# Patient Record
Sex: Female | Born: 1945 | Smoking: Never smoker
Health system: Southern US, Community
[De-identification: ages and names within clinical notes are randomized; demographics above are authoritative.]

## PROBLEM LIST (undated history)

## (undated) DIAGNOSIS — I10 Essential (primary) hypertension: Secondary | ICD-10-CM

## (undated) DIAGNOSIS — R928 Other abnormal and inconclusive findings on diagnostic imaging of breast: Secondary | ICD-10-CM

## (undated) DIAGNOSIS — K219 Gastro-esophageal reflux disease without esophagitis: Secondary | ICD-10-CM

## (undated) DIAGNOSIS — M810 Age-related osteoporosis without current pathological fracture: Secondary | ICD-10-CM

## (undated) DIAGNOSIS — E785 Hyperlipidemia, unspecified: Secondary | ICD-10-CM

## (undated) DIAGNOSIS — M069 Rheumatoid arthritis, unspecified: Secondary | ICD-10-CM

## (undated) DIAGNOSIS — R42 Dizziness and giddiness: Secondary | ICD-10-CM

## (undated) DIAGNOSIS — M1990 Unspecified osteoarthritis, unspecified site: Secondary | ICD-10-CM

## (undated) HISTORY — DX: Hyperlipidemia, unspecified: E78.5

## (undated) HISTORY — DX: Unspecified osteoarthritis, unspecified site: M19.90

## (undated) HISTORY — DX: Essential (primary) hypertension: I10

## (undated) HISTORY — DX: Rheumatoid arthritis, unspecified: M06.9

## (undated) HISTORY — DX: Other abnormal and inconclusive findings on diagnostic imaging of breast: R92.8

## (undated) HISTORY — DX: Age-related osteoporosis without current pathological fracture: M81.0

## (undated) HISTORY — DX: Gastro-esophageal reflux disease without esophagitis: K21.9

## (undated) HISTORY — DX: Dizziness and giddiness: R42

## (undated) HISTORY — PX: BREAST SURGERY: SHX581

---

## 2000-08-06 HISTORY — PX: COSMETIC SURGERY: SHX468

## 2005-09-20 HISTORY — PX: SHOULDER SURGERY: SHX246

## 2008-04-16 HISTORY — PX: COLONOSCOPY: SHX174

## 2008-04-21 HISTORY — PX: BLADDER SUSPENSION: SHX72

## 2011-12-06 HISTORY — PX: ESOPHAGOGASTRODUODENOSCOPY ENDOSCOPY: SHX5814

## 2013-10-23 DIAGNOSIS — R42 Dizziness and giddiness: Secondary | ICD-10-CM

## 2013-10-23 HISTORY — DX: Dizziness and giddiness: R42

## 2015-06-18 ENCOUNTER — Ambulatory Visit (INDEPENDENT_AMBULATORY_CARE_PROVIDER_SITE_OTHER): Payer: Medicare Other | Admitting: Obstetrics & Gynecology

## 2015-06-18 ENCOUNTER — Encounter: Payer: Self-pay | Admitting: Obstetrics & Gynecology

## 2015-06-18 VITALS — BP 125/73 | HR 65 | Ht 66.0 in | Wt 129.0 lb

## 2015-06-18 DIAGNOSIS — N905 Atrophy of vulva: Secondary | ICD-10-CM

## 2015-06-18 MED ORDER — AMBULATORY NON FORMULARY MEDICATION: Status: DC

## 2015-06-18 NOTE — Progress Notes (Signed)
   Subjective:    Patient ID: Erica Kirk, female    DOB: 1946/07/10, 70 y.o.   MRN: [id number]  HPI 69 yo MW P3 2 LC (34 and 74 yo kids, 2 grands) here with the complaint of vulvar itching for about 10 days. She tried nystatin with no help. She is sexually active about once per week. She uses a lube (? [place]).   Review of Systems     Objective:   Physical Exam  WNWHWFNAD Breathing, conversing, and ambulating normally Abd- benign EG, vagina- no lesions, no discharge, severe atrophy noted Bimanual exam all normal      Assessment & Plan:  Symptomatic vulvovaginal atrophy- compounded vaginal estrogen

## 2015-06-18 NOTE — Addendum Note (Signed)
Addended by: Arne Cleveland on: 06/18/2015 11:12 AM   Modules accepted: Orders

## 2015-06-18 NOTE — Progress Notes (Signed)
Vaginal itching x 1 week.  Had tried some Nystatin cream that she had, but not relief.

## 2015-06-25 ENCOUNTER — Encounter: Payer: Self-pay | Admitting: *Deleted

## 2015-08-05 ENCOUNTER — Other Ambulatory Visit (INDEPENDENT_AMBULATORY_CARE_PROVIDER_SITE_OTHER): Payer: Medicare Other | Admitting: *Deleted

## 2015-08-05 DIAGNOSIS — R300 Dysuria: Secondary | ICD-10-CM

## 2015-08-05 LAB — POCT URINALYSIS DIPSTICK
Bilirubin, UA: NEGATIVE
GLUCOSE UA: NEGATIVE
Ketones, UA: NEGATIVE
NITRITE UA: NEGATIVE
PROTEIN UA: NEGATIVE
Spec Grav, UA: 1.01
UROBILINOGEN UA: 0.2
pH, UA: 5

## 2015-08-05 MED ORDER — PHENAZOPYRIDINE HCL 200 MG PO TABS: 200.0000 mg | ORAL_TABLET | Freq: Three times a day (TID) | ORAL | Status: DC | PRN

## 2015-08-05 MED ORDER — SULFAMETHOXAZOLE-TRIMETHOPRIM 800-160 MG PO TABS
1.0000 | ORAL_TABLET | Freq: Two times a day (BID) | ORAL | Status: DC
Start: 2015-08-05 — End: 2015-08-10

## 2015-08-05 NOTE — Progress Notes (Signed)
Pt came in office c/o pain with urination, stated she had a UTI few years ago and it feels like the same symptoms she experienced when she had it then.  UA + leukocytes and blood, will send urine cx.  Informed pt that we typically like to receive the urine cx results before treatment but since it will be the weekend before any results come back we will send Bactrim DS to her pharmacy per protocol along with pyridium.  Instructed on medication use and to drink plenty of fluids.  Will contact pt on Monday with results.

## 2015-08-07 LAB — URINE CULTURE: Colony Count: >=100000

## 2015-08-09 ENCOUNTER — Telehealth: Payer: Self-pay | Admitting: *Deleted

## 2015-08-09 MED ORDER — CIPROFLOXACIN HCL 500 MG PO TABS: 500.0000 mg | ORAL_TABLET | Freq: Two times a day (BID) | ORAL | Status: DC

## 2015-08-09 NOTE — Telephone Encounter (Signed)
Spoke to pt about urine cx result, bacteria sensitive to Cipro, sent rx to pharmacy per protocol.  Pt notified of medication use.

## 2015-08-10 ENCOUNTER — Encounter: Payer: Self-pay | Admitting: Obstetrics & Gynecology

## 2015-08-10 ENCOUNTER — Ambulatory Visit (INDEPENDENT_AMBULATORY_CARE_PROVIDER_SITE_OTHER): Payer: Medicare Other | Admitting: Obstetrics & Gynecology

## 2015-08-10 VITALS — BP 108/71 | HR 79 | Wt 127.0 lb

## 2015-08-10 DIAGNOSIS — N362 Urethral caruncle: Secondary | ICD-10-CM | POA: Diagnosis not present

## 2015-08-10 DIAGNOSIS — R319 Hematuria, unspecified: Secondary | ICD-10-CM

## 2015-08-10 NOTE — Progress Notes (Signed)
   Subjective:    Patient ID: Erica Kirk, female    DOB: 1946-05-02, 69 y.o.   MRN: [id number]  HPI This adorable 69 yo MW lady who recently moved here from [place] is here with several days of bleeding when she wipes after voiding. She is currently being treated with cipro for a UTI (cultures sensitive, Klebsiella).  She never started the vaginal estrogen prescribed for vulvovaginal atrophy.   Review of Systems     Objective:   Physical Exam WNWHWFNAD Breathing, conversing, and ambulating normally She has what appear to be a bloody looking polyp at her urethra       Assessment & Plan:  ?urethral polyp- refer to urology

## 2015-08-18 ENCOUNTER — Ambulatory Visit (INDEPENDENT_AMBULATORY_CARE_PROVIDER_SITE_OTHER): Payer: Medicare Other | Admitting: Primary Care

## 2015-08-18 ENCOUNTER — Encounter: Payer: Self-pay | Admitting: Primary Care

## 2015-08-18 VITALS — BP 110/70 | HR 71 | Temp 97.7°F | Ht 66.0 in | Wt 129.8 lb

## 2015-08-18 DIAGNOSIS — M79675 Pain in left toe(s): Secondary | ICD-10-CM | POA: Diagnosis not present

## 2015-08-18 DIAGNOSIS — B351 Tinea unguium: Secondary | ICD-10-CM | POA: Diagnosis not present

## 2015-08-18 NOTE — Assessment & Plan Note (Signed)
Deep fungal-like process to left great toe. No improvement over 2-3 months with home remedies. Due to location and severity will send to podiatry for further evaluation and possible removal of toenail.

## 2015-08-18 NOTE — Progress Notes (Signed)
   Subjective:    Patient ID: Erica Kirk, female    DOB: December 01, 1945, 69 y.o.   MRN: [id number]  HPI  Erica Kirk is a 69 year old female who presents today with a chief complaint of left great toe pain. Her pain has been present for the past 2-3 months. She's also noticed a black discoloration under her left great toenail intermittently and has been trying to cut it out herself. She has a history of RA and is seeing a rheumatologist in [place]. Her pain is only present when wearing hardshelled shoes. Denies fevers, drainage, redness. She's been soaking her foot in peroxide without improvement. Overall her toenail continues to become more bothersome and is not healing.  Review of Systems  Constitutional: Negative for fever.  Musculoskeletal:       Left great toenail discoloration.   Skin: Positive for wound.       Past Medical History  Diagnosis Date  . Hypertension   . Mammogram abnormal   . Vertigo 10/23/13  . Osteoporosis     Social History   Social History  . Marital Status: Unknown    Spouse Name: N/A  . Number of Children: N/A  . Years of Education: N/A   Occupational History  . Not on file.   Social History Main Topics  . Smoking status: Never Smoker   . Smokeless tobacco: Never Used  . Alcohol Use: Yes     Comment: social  . Drug Use: No  . Sexual Activity: Yes   Other Topics Concern  . Not on file   Social History Narrative    Past Surgical History  Procedure Laterality Date  . Colonoscopy  04/16/08  . Esophagogastroduodenoscopy endoscopy  12/06/11  . Cosmetic surgery  08/06/00  . Shoulder surgery Left 09/20/05  . Bladder suspension  04/21/08    No family history on file.  Allergies  Allergen Reactions  . Demerol [Meperidine] Other (See Comments)    Passed out  . Nexium [Esomeprazole Magnesium] Rash    Current Outpatient Prescriptions on File Prior to Visit  Medication Sig Dispense Refill  . AMBULATORY NON FORMULARY MEDICATION Compound  Estrogen 0.02% 12 g 15  . cholecalciferol (VITAMIN D) 1000 UNITS tablet Take 2,000 Units by mouth daily.    . meloxicam (MOBIC) 7.5 MG tablet Take 7.5 mg by mouth daily.    . verapamil (CALAN) 120 MG tablet Take 120 mg by mouth 3 (three) times daily.     No current facility-administered medications on file prior to visit.    BP 110/70 mmHg  Pulse 71  Temp(Src) 97.7 F (36.5 C) (Oral)  Ht 5\' 6"  (1.676 m)  Wt 129 lb 12.8 oz (58.877 kg)  BMI 20.96 kg/m2  SpO2 98%    Objective:   Physical Exam  Constitutional: She appears well-nourished.  Cardiovascular: Normal rate and regular rhythm.   Pulmonary/Chest: Effort normal and breath sounds normal.  Musculoskeletal: Normal range of motion.  Skin:  Deep fungal like appearing growth present under left great toe nail. No erythema or infectious process noted.   Psychiatric: She has a normal mood and affect.          Assessment & Plan:

## 2015-08-18 NOTE — Patient Instructions (Signed)
Stop by the front and speak with Revonda Standard or Shirlee Limerick regarding your referral to Podiatry.  It was a pleasure meeting you!  Onychomycosis/Fungal Toenails  WHAT IS IT? An infection that lies within the keratin of your nail plate that is caused by a fungus.  WHY ME? Fungal infections affect all ages, sexes, races, and creeds.  There may be many factors that predispose you to a fungal infection such as age, coexisting medical conditions such as diabetes, or an autoimmune disease; stress, medications, fatigue, genetics, etc.  Bottom line: fungus thrives in a warm, moist environment and your shoes offer such a location.  IS IT CONTAGIOUS? Theoretically, yes.  You do not want to share shoes, nail clippers or files with someone who has fungal toenails.  Walking around barefoot in the same room or sleeping in the same bed is unlikely to transfer the organism.  It is important to realize, however, that fungus can spread easily from one nail to the next on the same foot.  HOW DO WE TREAT THIS?  There are several ways to treat this condition.  Treatment may depend on many factors such as age, medications, pregnancy, liver and kidney conditions, etc.  It is best to ask your doctor which options are available to you.  1. No treatment.   Unlike many other medical concerns, you can live with this condition.  However for many people this can be a painful condition and may lead to ingrown toenails or a bacterial infection.  It is recommended that you keep the nails cut short to help reduce the amount of fungal nail. 2. Topical treatment.  These range from herbal remedies to prescription strength nail lacquers.  About 40-50% effective, topicals require twice daily application for approximately 9 to 12 months or until an entirely new nail has grown out.  The most effective topicals are medical grade medications available through physicians offices. 3. Oral antifungal medications.  With an 80-90% cure rate, the most common  oral medication requires 3 to 4 months of therapy and stays in your system for a year as the new nail grows out.  Oral antifungal medications do require blood work to make sure it is a safe drug for you.  A liver function panel will be performed prior to starting the medication and after the first month of treatment.  It is important to have the blood work performed to avoid any harmful side effects.  In general, this medication safe but blood work is required. 4. Laser Therapy.  This treatment is performed by applying a specialized laser to the affected nail plate.  This therapy is noninvasive, fast, and non-painful.  It is not covered by insurance and is therefore, out of pocket.  The results have been very good with a 80-95% cure rate.  The [facility] is the only practice in the area to offer this therapy. 5. Permanent Nail Avulsion.  Removing the entire nail so that a new nail will not grow back.

## 2015-08-25 ENCOUNTER — Encounter: Payer: Self-pay | Admitting: Podiatry

## 2015-08-25 ENCOUNTER — Ambulatory Visit (INDEPENDENT_AMBULATORY_CARE_PROVIDER_SITE_OTHER): Payer: Medicare Other

## 2015-08-25 ENCOUNTER — Ambulatory Visit (INDEPENDENT_AMBULATORY_CARE_PROVIDER_SITE_OTHER): Payer: Medicare Other | Admitting: Podiatry

## 2015-08-25 VITALS — BP 116/71 | HR 69 | Resp 16

## 2015-08-25 DIAGNOSIS — R52 Pain, unspecified: Secondary | ICD-10-CM

## 2015-08-25 DIAGNOSIS — M898X7 Other specified disorders of bone, ankle and foot: Secondary | ICD-10-CM

## 2015-08-25 DIAGNOSIS — M2570 Osteophyte, unspecified joint: Secondary | ICD-10-CM

## 2015-08-25 DIAGNOSIS — L603 Nail dystrophy: Secondary | ICD-10-CM | POA: Diagnosis not present

## 2015-08-25 NOTE — Progress Notes (Signed)
   Subjective:    Patient ID: Erica Kirk, female    DOB: [birth year], 69 y.o.   MRN: [id number]  HPI: She presents today as a new patient with a chief complaint of discoloration and pain to the dorsal aspect of her hallux nail plate left foot. She was referred by her primary care provider for possible nail removal. She denies trauma to the nail. She's also complaining of a nodule or mass at the level of the first metatarsophalangeal joint. She states that the soreness is been present for approximately 2 months and she's tried peroxide and trimming the nail to help alleviate his symptoms. This did not help. She's recently been diagnosed with rheumatoid arthritis as of October 3. She will follow-up with rheumatologist for the first time later this week.    Review of Systems  HENT: Positive for tinnitus.   Musculoskeletal: Positive for arthralgias and gait problem.  All other systems reviewed and are negative.      Objective:   Physical Exam: 69 year old female in no apparent distress ambulates with an antalgic gait. Vital signs stable alert and oriented 3 pulses are strongly palpable. Neurologic sensorium is intact per Semmes-Weinstein monofilament. Deep tendon reflexes are intact bilateral muscle strength +5 over 5 dorsiflexors and plantar flexors and inverters and everters all intrinsic musculature is intact. Orthopedic evaluation demonstrates all joints distal to the ankle range of motion without crepitus however she does have hallux abductovalgus deformity bilateral with a large nonpulsatile mass appears to be bone on a posttraumatic injury of the first metatarsophalangeal joint dorsal lateral aspect of the joint. Cutaneous evaluation does demonstrate small dark area located in the distal aspect of the nail plate and the nail is trimmed closely. This is tender on direct palpation. In the discoloration extends to the distal most aspect of the nail margin. Radiographic evaluation confirms an  osseous fragment in the first metatarsophalangeal joint dorsal lateral aspect. Hallux valgus deformity is also noted. Lateral view with an elevated hallux does demonstrate a distal subungual exostosis. This is more than likely the reason for the discoloration of the nail plate.          Assessment & Plan:  Assessment: Subungual exostosis hallux left. Osseous mass first metatarsophalangeal joint left foot. Osseous fragment first metatarsophalangeal joint left foot.  Plan: Discussed etiology pathology conservative versus surgical therapies. We consented her today for surgical subungual exostectomy hallux left and excision osseous fragment first metatarsophalangeal joint left. We went over her consent form today line by line number by number giving her ample time to ask questions she saw fit regarding these procedures. Answered the questions to the best of my ability in layman's terms. We discussed the possible postop complications which may include but are not limited to postop pain bleeding swelling infection overcorrection under correction failure of the wounds to heal recurrence permanent changes of the nail plate possible nail loss. I will follow-up with her in the near future for surgical intervention.  Arbutus Ped DPM

## 2015-08-25 NOTE — Patient Instructions (Signed)
Pre-Operative Instructions  Congratulations, you have decided to take an important step to improving your quality of life.  You can be assured that the doctors of [facility] will be with you every step of the way.  1. Plan to be at the surgery center/hospital at least 1 (one) hour prior to your scheduled time unless otherwise directed by the surgical center/hospital staff.  You must have a responsible adult accompany you, remain during the surgery and drive you home.  Make sure you have directions to the surgical center/hospital and know how to get there on time. 2. For hospital based surgery you will need to obtain a history and physical form from your family physician within 1 month prior to the date of surgery- we will give you a form for you primary physician.  3. We make every effort to accommodate the date you request for surgery.  There are however, times where surgery dates or times have to be moved.  We will contact you as soon as possible if a change in schedule is required.   4. No Aspirin/Ibuprofen for one week before surgery.  If you are on aspirin, any non-steroidal anti-inflammatory medications (Mobic, Aleve, Ibuprofen) you should stop taking it 7 days prior to your surgery.  You make take Tylenol  For pain prior to surgery.  5. Medications- If you are taking daily heart and blood pressure medications, seizure, reflux, allergy, asthma, anxiety, pain or diabetes medications, make sure the surgery center/hospital is aware before the day of surgery so they may notify you which medications to take or avoid the day of surgery. 6. No food or drink after midnight the night before surgery unless directed otherwise by surgical center/hospital staff. 7. No alcoholic beverages 24 hours prior to surgery.  No smoking 24 hours prior to or 24 hours after surgery. 8. Wear loose pants or shorts- loose enough to fit over bandages, boots, and casts. 9. No slip on shoes, sneakers are best. 10. Bring  your boot with you to the surgery center/hospital.  Also bring crutches or a walker if your physician has prescribed it for you.  If you do not have this equipment, it will be provided for you after surgery. 11. If you have not been contracted by the surgery center/hospital by the day before your surgery, call to confirm the date and time of your surgery. 12. Leave-time from work may vary depending on the type of surgery you have.  Appropriate arrangements should be made prior to surgery with your employer. 13. Prescriptions will be provided immediately following surgery by your doctor.  Have these filled as soon as possible after surgery and take the medication as directed. 14. Remove nail polish on the operative foot. 15. Wash the night before surgery.  The night before surgery wash the foot and leg well with the antibacterial soap provided and water paying special attention to beneath the toenails and in between the toes.  Rinse thoroughly with water and dry well with a towel.  Perform this wash unless told not to do so by your physician.  Enclosed: 1 Ice pack (please put in freezer the night before surgery)   1 Hibiclens skin cleaner   Pre-op Instructions  If you have any questions regarding the instructions, do not hesitate to call our office.  [place]: [address] [place], [place] [address] [phone]  [place]: [address] [place], [place] [address] [phone]  [place]: [address]  , [place] [address] [phone]  Dr. Richard   Tuchman DPM, Dr. Norman Regal DPM Dr. Richard Sikora DPM, Dr. M. Todd Krista Godsil DPM, Dr. Kathryn Egerton DPM 

## 2015-08-30 ENCOUNTER — Encounter: Payer: Self-pay | Admitting: *Deleted

## 2015-09-01 ENCOUNTER — Telehealth: Payer: Self-pay | Admitting: *Deleted

## 2015-09-01 NOTE — Telephone Encounter (Signed)
I'm returning your call.  "Yes, I'm scheduled for surgery in December with Dr. Al Corpus.  I'm going to have to cancel it.  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 Al Corpus know and cancel surgery at surgical center.  I called and canceled surgery at [facility].

## 2015-09-01 NOTE — Telephone Encounter (Signed)
ok 

## 2015-09-13 ENCOUNTER — Encounter: Payer: Self-pay | Admitting: *Deleted

## 2015-09-29 ENCOUNTER — Encounter: Payer: Self-pay | Admitting: Podiatry

## 2015-10-06 ENCOUNTER — Encounter: Payer: Self-pay | Admitting: Podiatry

## 2015-10-20 ENCOUNTER — Encounter: Payer: Self-pay | Admitting: Primary Care

## 2015-10-20 ENCOUNTER — Ambulatory Visit (INDEPENDENT_AMBULATORY_CARE_PROVIDER_SITE_OTHER): Payer: Medicare Other | Admitting: Primary Care

## 2015-10-20 VITALS — BP 114/72 | HR 84 | Temp 98.0°F | Wt 123.0 lb

## 2015-10-20 DIAGNOSIS — J329 Chronic sinusitis, unspecified: Secondary | ICD-10-CM

## 2015-10-20 MED ORDER — AMOXICILLIN-POT CLAVULANATE 875-125 MG PO TABS
1.0000 | ORAL_TABLET | Freq: Two times a day (BID) | ORAL | Status: DC
Start: 2015-10-20 — End: 2016-01-13

## 2015-10-20 NOTE — Progress Notes (Signed)
Pre visit review using our clinic review tool, if applicable. No additional management support is needed unless otherwise documented below in the visit note. 

## 2015-10-20 NOTE — Patient Instructions (Signed)
Start Augmentin antibiotics. Take 1 tablet by mouth twice daily for 7 days.  Hold the Methotrexate and call your Rheumatologist for further instructions as far as when to restart.   Cough/Chest congestion: Mucinex DM. This may be purchased over the counter.  Nasal Congestion: Fluticasone (Flonase) nasal spray. Instill 2 sprays in each nostril once daily.  Increase consumption of water and rest.  It was a pleasure to see you today!

## 2015-10-20 NOTE — Progress Notes (Signed)
Subjective:    Patient ID: Erica Kirk, female    DOB: [birth year], 69 y.o.   MRN: [id number]  HPI  Erica Kirk is a 69 year old female who presents today with a chief complaint of sinus pressure. She also reports cough, sore throat, chest congestion, chills. Her cough is productive with brown sputum,, and she is blowing out thick, green mucous nasal cavity. Her symptoms have been present for 1 week and have gradually become worse. Today she feels worse. Denies fevers. She's not taken anything OTC for her symptoms.   Review of Systems  Constitutional: Positive for chills and fatigue. Negative for fever.  HENT: Positive for congestion, sinus pressure and sore throat. Negative for ear pain.   Respiratory: Positive for cough. Negative for shortness of breath.   Cardiovascular: Negative for chest pain.       Past Medical History  Diagnosis Date  . Hypertension   . Mammogram abnormal   . Vertigo 10/23/13  . Osteoporosis     Social History   Social History  . Marital Status: Unknown    Spouse Name: N/A  . Number of Children: N/A  . Years of Education: N/A   Occupational History  . Not on file.   Social History Main Topics  . Smoking status: Never Smoker   . Smokeless tobacco: Never Used  . Alcohol Use: Yes     Comment: social  . Drug Use: No  . Sexual Activity: Yes   Other Topics Concern  . Not on file   Social History Narrative    Past Surgical History  Procedure Laterality Date  . Colonoscopy  04/16/08  . Esophagogastroduodenoscopy endoscopy  12/06/11  . Cosmetic surgery  08/06/00  . Shoulder surgery Left 09/20/05  . Bladder suspension  04/21/08    No family history on file.  Allergies  Allergen Reactions  . Demerol [Meperidine] Other (See Comments)    Passed out  . Nexium [Esomeprazole Magnesium] Rash    Current Outpatient Prescriptions on File Prior to Visit  Medication Sig Dispense Refill  . AMBULATORY NON FORMULARY MEDICATION Compound  Estrogen 0.02% 12 g 15  . cholecalciferol (VITAMIN D) 1000 UNITS tablet Take 2,000 Units by mouth daily.    . Estradiol (ESTRACE PO) Take by mouth 2 (two) times a week.     . Probiotic Product (PROBIOTIC DAILY PO) Take 1 tablet by mouth daily.    . verapamil (CALAN) 120 MG tablet Take 120 mg by mouth 3 (three) times daily.     No current facility-administered medications on file prior to visit.    BP 114/72 mmHg  Pulse 84  Temp(Src) 98 F (36.7 C) (Oral)  Wt 123 lb (55.792 kg)    Objective:   Physical Exam  Constitutional: She appears well-nourished.  HENT:  Right Ear: Tympanic membrane and ear canal normal.  Left Ear: Tympanic membrane and ear canal normal.  Nose: Right sinus exhibits maxillary sinus tenderness and frontal sinus tenderness. Left sinus exhibits maxillary sinus tenderness. Left sinus exhibits no frontal sinus tenderness.  Mouth/Throat: Posterior oropharyngeal erythema present. No oropharyngeal exudate or posterior oropharyngeal edema.  Eyes: Conjunctivae are normal.  Neck: Neck supple.  Cardiovascular: Normal rate and regular rhythm.   Pulmonary/Chest: Effort normal and breath sounds normal. She has no rales.  Skin: Skin is warm and dry.          Assessment & Plan:  Acute Bacterial Sinusitis:  Sinus pressure, cough, sore throat x 1 week. Worse today. Blowing  thick, green mucous from nasal cavity. Right maxillary sinus very tender upon palpation with mild swelling. Lungs clear, exam otherwise unremarkable.  Will treat with Augmentin course and flonase PRN. Hold Methotrexate during treatment. Advised she notify her rheumatologist. Return precautions provided.

## 2016-01-13 ENCOUNTER — Other Ambulatory Visit: Payer: Self-pay

## 2016-01-13 ENCOUNTER — Ambulatory Visit (INDEPENDENT_AMBULATORY_CARE_PROVIDER_SITE_OTHER): Payer: Medicare Other | Admitting: Family Medicine

## 2016-01-13 ENCOUNTER — Encounter: Payer: Self-pay | Admitting: Family Medicine

## 2016-01-13 VITALS — BP 112/61 | HR 72 | Temp 98.2°F | Ht 66.0 in | Wt 120.5 lb

## 2016-01-13 DIAGNOSIS — Z1231 Encounter for screening mammogram for malignant neoplasm of breast: Secondary | ICD-10-CM

## 2016-01-13 DIAGNOSIS — M0579 Rheumatoid arthritis with rheumatoid factor of multiple sites without organ or systems involvement: Secondary | ICD-10-CM | POA: Diagnosis not present

## 2016-01-13 DIAGNOSIS — G90512 Complex regional pain syndrome I of left upper limb: Secondary | ICD-10-CM | POA: Diagnosis not present

## 2016-01-13 DIAGNOSIS — Z8719 Personal history of other diseases of the digestive system: Secondary | ICD-10-CM | POA: Diagnosis not present

## 2016-01-13 DIAGNOSIS — E7800 Pure hypercholesterolemia, unspecified: Secondary | ICD-10-CM

## 2016-01-13 DIAGNOSIS — N368 Other specified disorders of urethra: Secondary | ICD-10-CM

## 2016-01-13 DIAGNOSIS — M069 Rheumatoid arthritis, unspecified: Secondary | ICD-10-CM | POA: Insufficient documentation

## 2016-01-13 DIAGNOSIS — K219 Gastro-esophageal reflux disease without esophagitis: Secondary | ICD-10-CM

## 2016-01-13 DIAGNOSIS — G90519 Complex regional pain syndrome I of unspecified upper limb: Secondary | ICD-10-CM | POA: Insufficient documentation

## 2016-01-13 DIAGNOSIS — I10 Essential (primary) hypertension: Secondary | ICD-10-CM | POA: Insufficient documentation

## 2016-01-13 NOTE — Progress Notes (Signed)
Subjective:    Patient ID: Erica Kirk, female    DOB: 1946/02/06, 70 y.o.   MRN: [id number]  HPI  70 year old female presents to establish. She moved from [place] 1 year ago. Moved to be closer to grandchildren. previous MD Dr. Nelta Numbers. Last saw 12/31/2015.   RA, now followed by Dr. Nickola Major. Started on methotrexate. Symptoms are much improved now. Mainly issue with hands and knees.  minimal SE to methotrexate.   Did have gastritis versus PUD from meloxicam. Stomach pain has resolved. On protoinix 40 mg daily, now weaning off.  Did have endoscopy: nml 12/15/2015.  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  Cannot hold left hand open  No pain.  GERD, well controlled on no med.  Hypertension:    Well controlled on verapamil. BP Readings from Last 3 Encounters:  01/13/16 112/61  10/20/15 114/72  08/25/15 116/71  Using medication without problems or lightheadedness: None Chest pain with exertion: None Edema:none Short of breath:None Average home BPs: good control Other issues:  Elevated Cholesterol: Recent labs 12/31/2015  tot chol:239, HDL:93, LDL: 130 Diet compliance: Low chol diet Exercise: walking some, will restart. 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 Started on estrace.  Social History /Family History/Past Medical History reviewed and updated if needed. Review of Systems  Constitutional: Negative for fever and fatigue.  HENT: Negative for congestion and ear pain.   Eyes: Negative for pain.  Respiratory: Negative for cough, chest tightness and shortness of breath.   Cardiovascular: Negative for chest pain, palpitations and leg swelling.  Gastrointestinal: Negative for abdominal pain.  Genitourinary: Negative for dysuria and vaginal bleeding.  Musculoskeletal: Negative for back pain.  Neurological: Negative for syncope, light-headedness and headaches.  Psychiatric/Behavioral: Negative for dysphoric mood.         Objective:   Physical Exam  Constitutional: Vital signs are normal. She appears well-developed and well-nourished. She is cooperative.  Non-toxic appearance. She does not appear ill. No distress.  HENT:  Head: Normocephalic.  Right Ear: Hearing, tympanic membrane, external ear and ear canal normal. Tympanic membrane is not erythematous, not retracted and not bulging.  Left Ear: Hearing, tympanic membrane, external ear and ear canal normal. Tympanic membrane is not erythematous, not retracted and not bulging.  Nose: No mucosal edema or rhinorrhea. Right sinus exhibits no maxillary sinus tenderness and no frontal sinus tenderness. Left sinus exhibits no maxillary sinus tenderness and no frontal sinus tenderness.  Mouth/Throat: Uvula is midline, oropharynx is clear and moist and mucous membranes are normal.  Eyes: Conjunctivae, EOM and lids are normal. Pupils are equal, round, and reactive to light. Lids are everted and swept, no foreign bodies found.  Neck: Trachea normal and normal range of motion. Neck supple. Carotid bruit is not present. No thyroid mass and no thyromegaly present.  Cardiovascular: Normal rate, regular rhythm, S1 normal, S2 normal, normal heart sounds, intact distal pulses and normal pulses.  Exam reveals no gallop and no friction rub.   No murmur heard. Pulmonary/Chest: Effort normal and breath sounds normal. No tachypnea. No respiratory distress. She has no decreased breath sounds. She has no wheezes. She has no rhonchi. She has no rales.  Abdominal: Soft. Normal appearance and bowel sounds are normal. There is no tenderness.  Musculoskeletal:       Right hand: She exhibits decreased range of motion.       Left hand: She exhibits decreased range of motion.  Left hand with weakness and unable to  close fist from RSD  Bilateral hands with MCP swelling, DIP and PIP deformity, no heat or redness.  Grip strength 2/5 right, 1/5 on left.  Neurological: She is alert.  Skin: Skin  is warm, dry and intact. No rash noted.  Psychiatric: Her speech is normal and behavior is normal. Judgment and thought content normal. Her mood appears not anxious. Cognition and memory are normal. She does not exhibit a depressed mood.          Assessment & Plan:

## 2016-01-13 NOTE — Assessment & Plan Note (Signed)
Dx 2016 on methotrexate.  Mainly in knees and hand. Followed by Zenovia Jordan.

## 2016-01-13 NOTE — Progress Notes (Signed)
Pre visit review using our clinic review tool, if applicable. No additional management support is needed unless otherwise documented below in the visit note. 

## 2016-01-13 NOTE — Assessment & Plan Note (Signed)
Well controlleld on verapamil.

## 2016-01-13 NOTE — Assessment & Plan Note (Signed)
On estrace followed by Dr. Ronne Binning [facility].

## 2016-01-13 NOTE — Patient Instructions (Addendum)
Call to schedule mammogram on your own.  Get back to regular exercise and continue low cholesterol diet.

## 2016-01-13 NOTE — Assessment & Plan Note (Addendum)
Low risk for CVD. Encouraged exercise, weight loss, healthy eating habits.

## 2016-01-13 NOTE — Assessment & Plan Note (Signed)
Controlled with diet and lifestyle.

## 2016-01-13 NOTE — Assessment & Plan Note (Signed)
Weaning off protonix.

## 2016-01-26 ENCOUNTER — Ambulatory Visit
Admission: RE | Admit: 2016-01-26 | Discharge: 2016-01-26 | Disposition: A | Discharge status: Home / Self Care | Payer: Medicare Other | Source: Ambulatory Visit

## 2016-01-26 DIAGNOSIS — Z1231 Encounter for screening mammogram for malignant neoplasm of breast: Secondary | ICD-10-CM

## 2016-02-28 ENCOUNTER — Other Ambulatory Visit: Payer: Self-pay | Admitting: Family Medicine

## 2016-04-20 ENCOUNTER — Ambulatory Visit: Payer: Self-pay | Admitting: Family Medicine

## 2016-06-23 ENCOUNTER — Other Ambulatory Visit: Payer: Self-pay

## 2016-06-23 MED ORDER — FLUTICASONE PROPIONATE 50 MCG/ACT NA SUSP: 2.0000 | Freq: Every day | NASAL | 11 refills | Status: DC

## 2016-06-23 NOTE — Telephone Encounter (Signed)
Okay to fill for 1 year 2 sprays per nostril.

## 2016-06-23 NOTE — Telephone Encounter (Signed)
Pt left /vm; pt established care on 01/13/16; pt request rx for flonase [facility] m[facility]. Not on med list and I do not see discussed at 01/13/16 appt.Please advise.

## 2016-06-23 NOTE — Telephone Encounter (Signed)
[facility] [address] as instructed by Dr. Ermalene Searing. Ms. Eilers notified.

## 2016-06-29 ENCOUNTER — Telehealth: Payer: Self-pay

## 2016-06-29 MED ORDER — TRIAMCINOLONE ACETONIDE 0.1 % EX CREA: 1.0000 "application " | TOPICAL_CREAM | Freq: Two times a day (BID) | CUTANEOUS | 1 refills | Status: DC

## 2016-06-29 MED ORDER — HYDROCORTISONE 2.5 % RE CREA: 1.0000 "application " | TOPICAL_CREAM | Freq: Two times a day (BID) | RECTAL | 1 refills | Status: DC

## 2016-06-29 NOTE — Telephone Encounter (Signed)
Okay to fill as requested 1 RF or put in and send to me.

## 2016-06-29 NOTE — Telephone Encounter (Signed)
Pt left /vm; pt established care 01/13/16; pt request proctosol HC 2.5 cream for hemorrhoids and triamcinolone acetonide 0.1% cream for insect bites. Dr Ermalene Searing has not prescribed before.Please advise. [facility].

## 2016-06-29 NOTE — Telephone Encounter (Signed)
Prescriptions sent to [facility] [address]   Ms. Rolla Plate notified.

## 2016-07-13 ENCOUNTER — Telehealth: Payer: Self-pay | Admitting: Family Medicine

## 2016-07-13 DIAGNOSIS — E7800 Pure hypercholesterolemia, unspecified: Secondary | ICD-10-CM

## 2016-07-13 DIAGNOSIS — Z1159 Encounter for screening for other viral diseases: Secondary | ICD-10-CM

## 2016-07-13 NOTE — Telephone Encounter (Signed)
-----   Message from Alvina Chou sent at 07/12/2016  5:59 PM EDT ----- Regarding: Lab orders for Friday, 9.22.17 Lab orders for a 6 month follow up appt

## 2016-07-17 ENCOUNTER — Other Ambulatory Visit: Payer: Medicare Other

## 2016-07-18 ENCOUNTER — Encounter: Payer: Self-pay | Admitting: *Deleted

## 2016-07-18 ENCOUNTER — Other Ambulatory Visit: Payer: Medicare Other

## 2016-07-18 ENCOUNTER — Other Ambulatory Visit (INDEPENDENT_AMBULATORY_CARE_PROVIDER_SITE_OTHER): Payer: Medicare Other

## 2016-07-18 ENCOUNTER — Ambulatory Visit (INDEPENDENT_AMBULATORY_CARE_PROVIDER_SITE_OTHER): Payer: Medicare Other

## 2016-07-18 DIAGNOSIS — E7800 Pure hypercholesterolemia, unspecified: Secondary | ICD-10-CM | POA: Diagnosis not present

## 2016-07-18 DIAGNOSIS — Z1159 Encounter for screening for other viral diseases: Secondary | ICD-10-CM

## 2016-07-18 DIAGNOSIS — Z23 Encounter for immunization: Secondary | ICD-10-CM | POA: Diagnosis not present

## 2016-07-18 LAB — COMPREHENSIVE METABOLIC PANEL
ALT: 15 U/L (ref 0–35)
AST: 17 U/L (ref 0–37)
Albumin: 4.1 g/dL (ref 3.5–5.2)
Alkaline Phosphatase: 71 U/L (ref 39–117)
BILIRUBIN TOTAL: 1 mg/dL (ref 0.2–1.2)
BUN: 14 mg/dL (ref 6–23)
CALCIUM: 9.3 mg/dL (ref 8.4–10.5)
CHLORIDE: 103 meq/L (ref 96–112)
CO2: 32 meq/L (ref 19–32)
Creatinine, Ser: 0.68 mg/dL (ref 0.40–1.20)
GFR: 91 mL/min (ref >60.00)
GLUCOSE: 83 mg/dL (ref 70–99)
POTASSIUM: 4.4 meq/L (ref 3.5–5.1)
Sodium: 139 mEq/L (ref 135–145)
Total Protein: 7.5 g/dL (ref 6.0–8.3)

## 2016-07-18 LAB — LIPID PANEL
CHOL/HDL RATIO: 2
Cholesterol: 199 mg/dL (ref 0–200)
HDL: 79.6 mg/dL (ref >39.00)
LDL Cholesterol: 105 mg/dL — ABNORMAL HIGH (ref 0–99)
NonHDL: 119.08
TRIGLYCERIDES: 71 mg/dL (ref 0.0–149.0)
VLDL: 14.2 mg/dL (ref 0.0–40.0)

## 2016-07-19 LAB — HEPATITIS C ANTIBODY: HCV AB: NEGATIVE

## 2016-07-25 ENCOUNTER — Ambulatory Visit (INDEPENDENT_AMBULATORY_CARE_PROVIDER_SITE_OTHER)
Admission: RE | Admit: 2016-07-25 | Discharge: 2016-07-25 | Disposition: A | Discharge status: Home / Self Care | Payer: Medicare Other | Source: Ambulatory Visit | Attending: Primary Care | Admitting: Primary Care

## 2016-07-25 ENCOUNTER — Encounter: Payer: Self-pay | Admitting: Primary Care

## 2016-07-25 ENCOUNTER — Ambulatory Visit (INDEPENDENT_AMBULATORY_CARE_PROVIDER_SITE_OTHER): Payer: Medicare Other | Admitting: Primary Care

## 2016-07-25 VITALS — BP 116/70 | HR 71 | Temp 97.8°F | Ht 66.0 in | Wt 121.8 lb

## 2016-07-25 DIAGNOSIS — M25562 Pain in left knee: Secondary | ICD-10-CM | POA: Diagnosis not present

## 2016-07-25 DIAGNOSIS — M25571 Pain in right ankle and joints of right foot: Secondary | ICD-10-CM

## 2016-07-25 NOTE — Progress Notes (Signed)
Pre visit review using our clinic review tool, if applicable. No additional management support is needed unless otherwise documented below in the visit note. 

## 2016-07-25 NOTE — Progress Notes (Signed)
Subjective:    Patient ID: Erica Kirk, female    DOB: October 24, 1945, 70 y.o.   MRN: [id number]  HPI  Erica Kirk is a 70 year old female who presents today with a chief complaint of left knee, right ankle, and right wrist pain. She was walking this morning and tripped over her cat which caused her to fall landing on her right wrist and left knee onto hardwood floors. She also twisted her right ankle. She had immediate pain to her knee and ankle post fall. She has had minor swelling. She took her methotrexate injection yesterday morning for her rheumatoid arthritis. She is walking with a limp as she is unable to put much weight on her ankle. Most of her pain is to the left anterior knee and right ankle. She did not hit her head. Denies numbness/tingling, headaches, dizziness, acute confusion.  Review of Systems  Cardiovascular: Negative for chest pain.  Musculoskeletal: Positive for gait problem. Negative for joint swelling.       Left patellar pain, right ankle pain  Neurological: Negative for dizziness and headaches.       Past Medical History:  Diagnosis Date  . Arthritis   . GERD (gastroesophageal reflux disease)   . Hyperlipidemia   . Hypertension   . Mammogram abnormal   . Osteoporosis   . Vertigo 10/23/13     Social History   Social History  . Marital status: Unknown    Spouse name: N/A  . Number of children: N/A  . Years of education: N/A   Occupational History  . Not on file.   Social History Main Topics  . Smoking status: Never Smoker  . Smokeless tobacco: Never Used  . Alcohol use Yes     Comment: social  . Drug use: No  . Sexual activity: Yes   Other Topics Concern  . Not on file   Social History Narrative   3 sons, one deceased    Other children live in [place].    Diet and exercise: good, walks few times a week.   Retired Runner, broadcasting/film/video    Married    Past Surgical History:  Procedure Laterality Date  . BLADDER SUSPENSION  04/21/08  . BREAST  SURGERY    . COLONOSCOPY  04/16/08  . COSMETIC SURGERY  08/06/00  . ESOPHAGOGASTRODUODENOSCOPY ENDOSCOPY  12/06/11  . SHOULDER SURGERY Left 09/20/05    Family History  Problem Relation Age of Onset  . Heart disease Father     Allergies  Allergen Reactions  . Demerol [Meperidine] Other (See Comments)    Passed out  . Nexium [Esomeprazole Magnesium] Rash    Current Outpatient Prescriptions on File Prior to Visit  Medication Sig Dispense Refill  . AMBULATORY NON FORMULARY MEDICATION Compound Estrogen 0.02% 12 g 15  . cholecalciferol (VITAMIN D) 1000 UNITS tablet Take 2,000 Units by mouth daily.    Marland Kitchen estradiol (ESTRACE) 0.1 MG/GM vaginal cream Place 1 Applicatorful vaginally 3 (three) times a week.    . fluticasone (FLONASE) 50 MCG/ACT nasal spray Place 2 sprays into both nostrils daily. 16 g 11  . folic acid (FOLVITE) 1 MG tablet Take 1 mg by mouth daily.    . Probiotic Product (PROBIOTIC DAILY PO) Take 1 tablet by mouth daily. [employer] Probiotics with Enzymes    . triamcinolone cream (KENALOG) 0.1 % Apply 1 application topically 2 (two) times daily. 30 g 1  . verapamil (VERELAN PM) 120 MG 24 hr capsule TAKE 1 CAPSULE BY MOUTH  DAILY 90 capsule 1   No current facility-administered medications on file prior to visit.     BP 116/70   Pulse 71   Temp 97.8 F (36.6 C) (Oral)   Ht 5\' 6"  (1.676 m)   Wt 121 lb 12.8 oz (55.2 kg)   SpO2 97%   BMI 19.66 kg/m    Objective:   Physical Exam  Constitutional: She appears well-nourished.  Cardiovascular: Normal rate and regular rhythm.   Pulses:      Radial pulses are 2+ on the right side.       Dorsalis pedis pulses are 2+ on the right side.       Posterior tibial pulses are 2+ on the right side.  Pulmonary/Chest: Effort normal and breath sounds normal.  Musculoskeletal:       Right wrist: She exhibits normal range of motion, no tenderness, no bony tenderness, no swelling, no crepitus and no deformity.       Left knee: She exhibits  decreased range of motion, swelling and bony tenderness. She exhibits no ecchymosis, no deformity, no erythema and normal alignment.       Right ankle: She exhibits decreased range of motion and swelling. She exhibits no deformity and normal pulse. Tenderness. Lateral malleolus tenderness found.  Patellar tenderness, no deformity.   Skin: Skin is warm and dry. No erythema.          Assessment & Plan:  Ankle and Knee Pain:  Fall this morning as she tripped over her cat. Did not hit her head. Pain mostly to right ankle and left patella. Mild swelling with significant decrease in ROM to right ankle. No obvious deformity to patella and ankle. Good pulses. Given fall, will obtain xrays of left patella and right ankle. Provided ASO brace. She declines crutches.  Discussed RICE instructions. Will await xray results.  , NP

## 2016-07-25 NOTE — Patient Instructions (Signed)
Complete xray(s) prior to leaving today.   Wear the brace for ankle support. Ice your ankle three times daily for 20 minute intervals and elevate your ankle to reduce any swelling.   I will be in touch later today with your results.  It was a pleasure to see you today!

## 2016-08-20 ENCOUNTER — Other Ambulatory Visit: Payer: Self-pay | Admitting: Family Medicine

## 2016-12-07 ENCOUNTER — Telehealth: Payer: Self-pay | Admitting: *Deleted

## 2016-12-07 DIAGNOSIS — B3731 Acute candidiasis of vulva and vagina: Secondary | ICD-10-CM

## 2016-12-07 DIAGNOSIS — B373 Candidiasis of vulva and vagina: Secondary | ICD-10-CM

## 2016-12-07 MED ORDER — FLUCONAZOLE 150 MG PO TABS: 150.0000 mg | ORAL_TABLET | Freq: Once | ORAL | 0 refills | Status: AC

## 2016-12-07 MED ORDER — NYSTATIN 100000 UNIT/GM EX CREA: 1.0000 "application " | TOPICAL_CREAM | Freq: Two times a day (BID) | CUTANEOUS | 0 refills | Status: DC

## 2016-12-07 NOTE — Telephone Encounter (Signed)
Pt called c/o vaginal itching, believes it could be related to a yeast infection.  Has used Nystatin cream in the past and feels that helped her. Sent Diflucan and Nystatin cream to the pharmacy per Dr Marice Potter order.

## 2017-01-11 ENCOUNTER — Telehealth: Payer: Self-pay | Admitting: Family Medicine

## 2017-01-11 DIAGNOSIS — E7800 Pure hypercholesterolemia, unspecified: Secondary | ICD-10-CM

## 2017-01-11 NOTE — Telephone Encounter (Signed)
-----   Message from Baldomero Lamy sent at 01/11/2017  1:59 PM EDT ----- Regarding: Cpx labs Tues 3/27, need orders. Thanks! :-) Please order  future cpx labs for pt's upcoming lab appt. Thanks Rodney Booze

## 2017-01-16 ENCOUNTER — Other Ambulatory Visit (INDEPENDENT_AMBULATORY_CARE_PROVIDER_SITE_OTHER): Payer: Medicare Other

## 2017-01-16 DIAGNOSIS — E7800 Pure hypercholesterolemia, unspecified: Secondary | ICD-10-CM | POA: Diagnosis not present

## 2017-01-16 LAB — LIPID PANEL
CHOL/HDL RATIO: 3
Cholesterol: 190 mg/dL (ref 0–200)
HDL: 70.1 mg/dL (ref >39.00)
LDL CALC: 108 mg/dL — AB (ref 0–99)
NONHDL: 120.35
TRIGLYCERIDES: 62 mg/dL (ref 0.0–149.0)
VLDL: 12.4 mg/dL (ref 0.0–40.0)

## 2017-01-16 LAB — COMPREHENSIVE METABOLIC PANEL
ALBUMIN: 4.1 g/dL (ref 3.5–5.2)
ALT: 13 U/L (ref 0–35)
AST: 15 U/L (ref 0–37)
Alkaline Phosphatase: 62 U/L (ref 39–117)
BILIRUBIN TOTAL: 0.8 mg/dL (ref 0.2–1.2)
BUN: 16 mg/dL (ref 6–23)
CHLORIDE: 104 meq/L (ref 96–112)
CO2: 29 mEq/L (ref 19–32)
CREATININE: 0.66 mg/dL (ref 0.40–1.20)
Calcium: 9.4 mg/dL (ref 8.4–10.5)
GFR: 94.06 mL/min (ref >60.00)
Glucose, Bld: 89 mg/dL (ref 70–99)
Potassium: 4.1 mEq/L (ref 3.5–5.1)
Sodium: 138 mEq/L (ref 135–145)
Total Protein: 7.1 g/dL (ref 6.0–8.3)

## 2017-01-23 ENCOUNTER — Ambulatory Visit (INDEPENDENT_AMBULATORY_CARE_PROVIDER_SITE_OTHER): Payer: Medicare Other | Admitting: Family Medicine

## 2017-01-23 ENCOUNTER — Encounter: Payer: Self-pay | Admitting: Family Medicine

## 2017-01-23 ENCOUNTER — Encounter (INDEPENDENT_AMBULATORY_CARE_PROVIDER_SITE_OTHER): Payer: Self-pay

## 2017-01-23 VITALS — BP 100/58 | HR 62 | Temp 98.3°F | Ht 64.75 in | Wt 122.8 lb

## 2017-01-23 DIAGNOSIS — Z0000 Encounter for general adult medical examination without abnormal findings: Secondary | ICD-10-CM | POA: Diagnosis not present

## 2017-01-23 DIAGNOSIS — I10 Essential (primary) hypertension: Secondary | ICD-10-CM | POA: Diagnosis not present

## 2017-01-23 DIAGNOSIS — M0579 Rheumatoid arthritis with rheumatoid factor of multiple sites without organ or systems involvement: Secondary | ICD-10-CM

## 2017-01-23 DIAGNOSIS — E7800 Pure hypercholesterolemia, unspecified: Secondary | ICD-10-CM | POA: Diagnosis not present

## 2017-01-23 DIAGNOSIS — M810 Age-related osteoporosis without current pathological fracture: Secondary | ICD-10-CM | POA: Insufficient documentation

## 2017-01-23 NOTE — Assessment & Plan Note (Signed)
Recent increase in methotrexate for worsening inflammation in right hand

## 2017-01-23 NOTE — Assessment & Plan Note (Signed)
Well controlled. Continue current medication. Encouraged exercise, weight loss, healthy eating habits.  

## 2017-01-23 NOTE — Progress Notes (Signed)
Pre visit review using our clinic review tool, if applicable. No additional management support is needed unless otherwise documented below in the visit note. 

## 2017-01-23 NOTE — Patient Instructions (Addendum)
Work  on increasing regular exercise as able.  Stop at front desk to set bone density.

## 2017-01-23 NOTE — Progress Notes (Addendum)
Subjective:    Patient ID: Erica Kirk, female    DOB: [birth year], 71 y.o.   MRN: [id number]  HPI   Pt presents for AMW, complete physical and follow up of chronic health issues. I have personally reviewed the Medicare Annual Wellness questionnaire and have noted 1. The patient's medical and social history 2. Their use of alcohol, tobacco or illicit drugs 3. Their current medications and supplements 4. The patient's functional ability including ADL's, fall risks, home safety risks and hearing or visual             impairment. 5. Diet and physical activities 6. Evidence for depression or mood disorders 7.         Updated provider list Cognitive evaluation was performed and recorded on pt medicare questionnaire form. The patients weight, height, BMI and visual acuity have been recorded in the chart  I have made referrals, counseling and provided education to the patient based review of the above and I have provided the pt with a written personalized care plan for preventive services.    RA, moderate control on methotrexate, plans increase given right hand pain increased.. Followed by rheum, Dr. Nickola Major. Seeing every 3 months.  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  Hypertension:   good control on verapamil.Marland Kitchen Possibly overtreated.. Low normal today  At rheum 130/80 BP Readings from Last 3 Encounters:  01/23/17 (!) 100/58  07/25/16 116/70  01/13/16 112/61  Using medication without problems or lightheadedness: none Chest pain with exertion: None  Edema: none Short of breath: none Average home BPs: not checking at home. Other issues:  Wt Readings from Last 3 Encounters:  01/23/17 122 lb 12 oz (55.7 kg)  07/25/16 121 lb 12.8 oz (55.2 kg)  01/13/16 120 lb 8 oz (54.7 kg)   Elevated Cholesterol:  Improved in last year. 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 No hematuria.  Social History /Family History/Past Medical History reviewed and updated if needed. Blood pressure (!) 100/58, pulse 62, temperature 98.3 F (36.8 C), temperature source Oral, height 5' 4.75" (1.645 m), weight 122 lb 12 oz (55.7 kg).  Review of Systems  Constitutional: Negative for fatigue and fever.  HENT: Negative for congestion.   Eyes: Negative for pain.  Respiratory: Negative for cough and shortness of breath.   Cardiovascular: Negative for chest pain, palpitations and leg swelling.  Gastrointestinal: Negative for abdominal pain.  Genitourinary: Negative for dysuria and vaginal bleeding.  Musculoskeletal: Negative for back pain.  Neurological: Negative for syncope, light-headedness and headaches.  Psychiatric/Behavioral: Negative for dysphoric mood.       Objective:   Physical Exam  Constitutional: Vital signs are normal. She appears well-developed and well-nourished. She is cooperative.  Non-toxic appearance. She does not appear ill. No distress.  HENT:  Head: Normocephalic.  Right Ear: Hearing, tympanic membrane, external ear and ear canal normal.  Left Ear: Hearing, tympanic membrane, external ear and ear canal normal.  Nose: Nose normal.  Eyes: Conjunctivae, EOM and lids are normal. Pupils are equal, round, and reactive to light. Lids are everted and swept, no foreign bodies  found.  Neck: Trachea normal and normal range of motion. Neck supple. Carotid bruit is not present. No thyroid mass and no thyromegaly present.  Cardiovascular: Normal rate, regular rhythm, S1 normal, S2 normal, normal heart sounds and intact distal pulses.  Exam reveals no gallop.   No murmur  heard. Pulmonary/Chest: Effort normal and breath sounds normal. No respiratory distress. She has no wheezes. She has no rhonchi. She has no rales.  Abdominal: Soft. Normal appearance and bowel sounds are normal. She exhibits no distension, no fluid wave, no abdominal bruit and no mass. There is no hepatosplenomegaly. There is no tenderness. There is no rebound, no guarding and no CVA tenderness. No hernia.  Lymphadenopathy:    She has no cervical adenopathy.    She has no axillary adenopathy.  Neurological: She is alert. She has normal strength. No cranial nerve deficit or sensory deficit.  Skin: Skin is warm, dry and intact. No rash noted.  Psychiatric: Her speech is normal and behavior is normal. Judgment normal. Her mood appears not anxious. Cognition and memory are normal. She does not exhibit a depressed mood.  Joint deformity in hands from RSD and RA.  Swelling and redness in right MCPs.        Assessment & Plan:  The patient's preventative maintenance and recommended screening tests for an annual wellness exam were reviewed in full today. Brought up to date unless services declined.  Counselled on the importance of diet, exercise, and its role in overall health and mortality. The patient's FH and SH was reviewed, including their home life, tobacco status, and drug and alcohol status.   Vaccines: Uptodate with flu, PNA 23, 13 and Td. Due for shingles Pap/DVE:  No  indication for pap or DVE.  No symptoms and no family history of ovarian and uterine cancer. Pt agreeable. Mammo: 02/01/2016, plan q2 years. Bone Density: 12/16/2014  osteoporosis.  Not interested in medication to treat.Due for repeat. Colon:04/16/2008, nml. Plan repeat in 10 years.  Smoking Status: nonsmoker ETOH/ drug BCW:UGQB  Hep C:   done

## 2017-01-23 NOTE — Assessment & Plan Note (Signed)
Due for re-eval . Pt hesitant to treat given SE of meds in past.  Weight bearing exercise and ca/vit D.

## 2017-01-23 NOTE — Assessment & Plan Note (Signed)
Good control on no med. 

## 2017-02-06 ENCOUNTER — Ambulatory Visit
Admission: RE | Admit: 2017-02-06 | Discharge: 2017-02-06 | Disposition: A | Discharge status: Home / Self Care | Payer: Medicare Other | Source: Ambulatory Visit | Attending: Family Medicine | Admitting: Family Medicine

## 2017-02-06 DIAGNOSIS — M810 Age-related osteoporosis without current pathological fracture: Secondary | ICD-10-CM

## 2017-02-12 ENCOUNTER — Other Ambulatory Visit: Payer: Self-pay | Admitting: Family Medicine

## 2017-02-14 ENCOUNTER — Telehealth: Payer: Self-pay | Admitting: *Deleted

## 2017-02-14 NOTE — Telephone Encounter (Signed)
Erica Kirk notified that we received her Dexa report from 2016.  She is stable from last check per Dr. Ermalene Searing.  Not better but not worse.  She is not currently interested in treatment at this time.  Advised we we recheck Dexa again in 2 years but to make sure she is getting calcium in her diet, taking a Vitamin D supplement and exercise.  Patient states understanding.

## 2017-04-05 IMAGING — DX DG ANKLE COMPLETE 3+V*R*
3 series · 3 of 3 positions shown · non-contrast
Comparison: None.

CLINICAL DATA: Moderate to severe right ankle pain after a fall
today

EXAM:
RIGHT ANKLE - COMPLETE 3+ VIEW

[ankle ap]
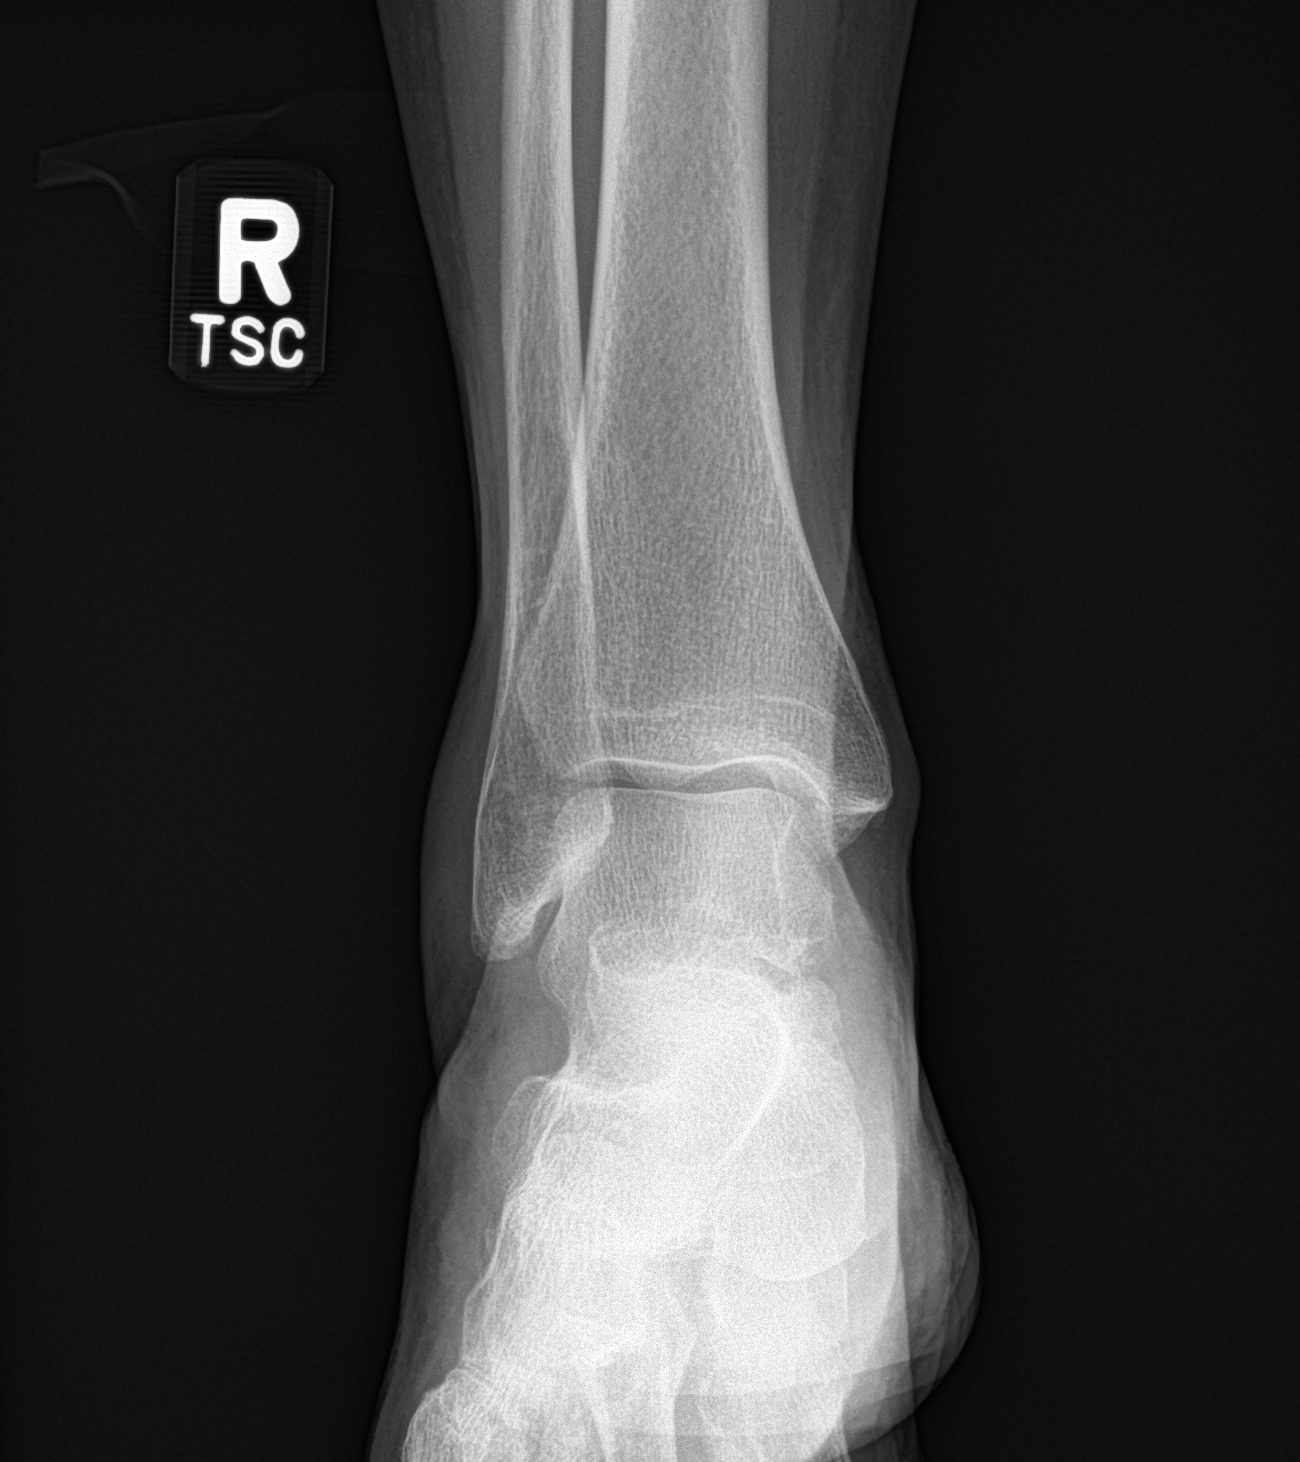

[ankle obl]
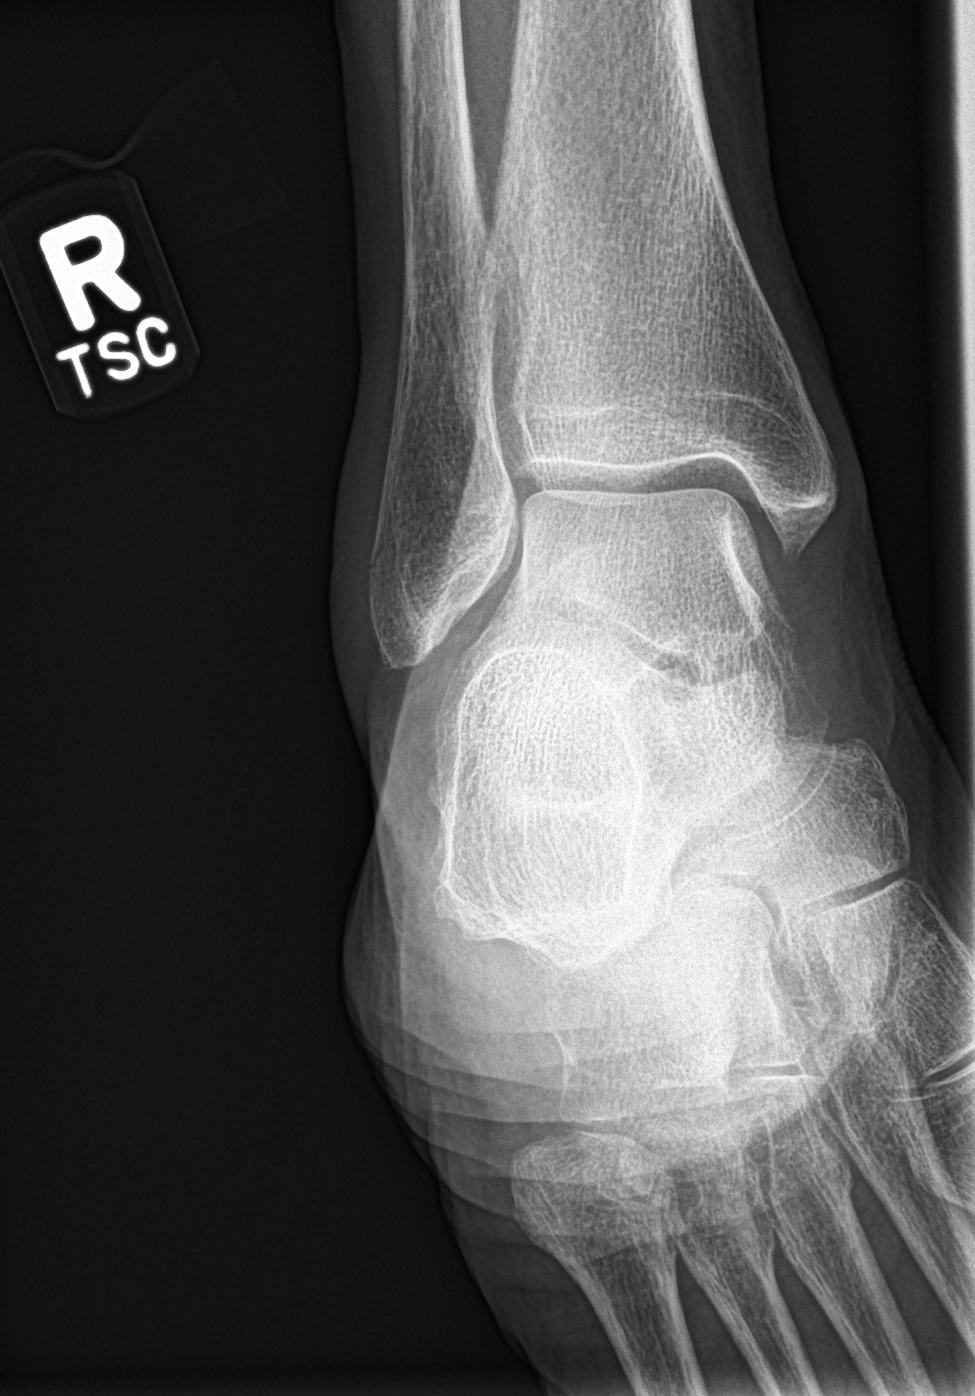

[ankle lat]
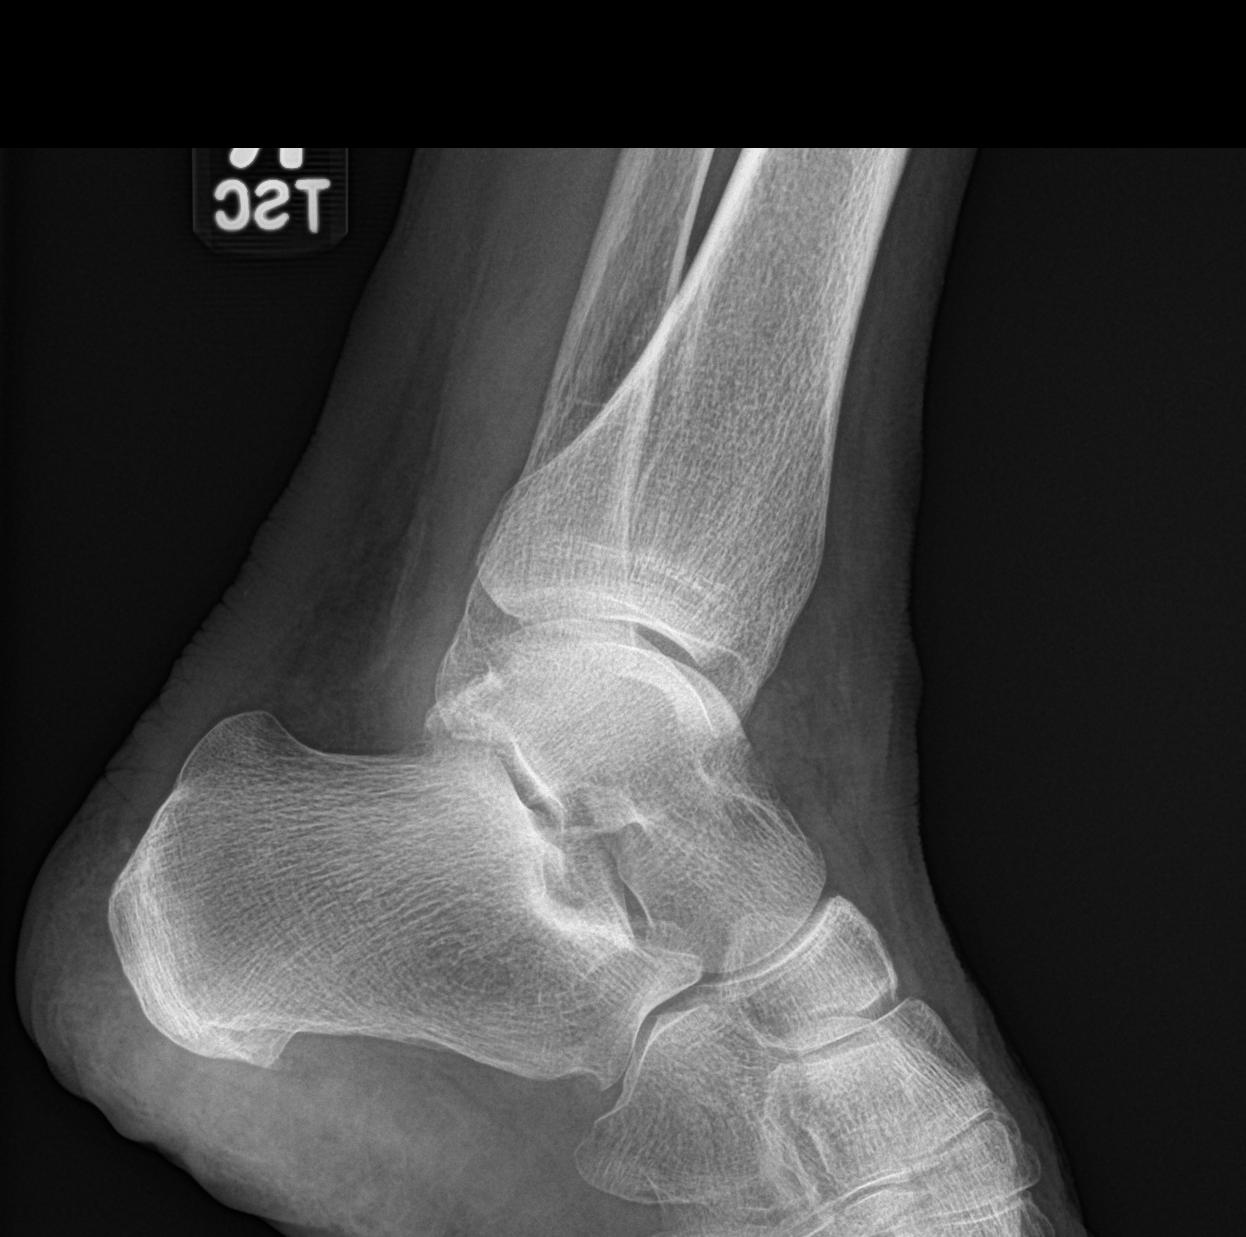

[3 of 3 positions shown; findings below may reference images not displayed]

FINDINGS: The right ankle joint appears normal. Alignment is normal. No
fracture is seen.
IMPRESSION: Negative.

## 2017-05-19 ENCOUNTER — Other Ambulatory Visit: Payer: Self-pay | Admitting: Family Medicine

## 2017-06-14 ENCOUNTER — Other Ambulatory Visit: Payer: Self-pay | Admitting: Family Medicine

## 2017-06-15 NOTE — Telephone Encounter (Signed)
Pt stated she uses this for bug bites

## 2017-06-15 NOTE — Telephone Encounter (Signed)
Left message for Ms. Sportsman to return my call and let us know what she is using the triamcinolone cream for currently.

## 2017-06-15 NOTE — Telephone Encounter (Signed)
Last office visit 01/23/2017.  Last refilled 06/29/2016 for 30 g with 1 refill.  Ok to refill?

## 2017-06-15 NOTE — Telephone Encounter (Signed)
What is she using this for now?

## 2017-08-10 ENCOUNTER — Ambulatory Visit (INDEPENDENT_AMBULATORY_CARE_PROVIDER_SITE_OTHER): Payer: Medicare Other

## 2017-08-10 DIAGNOSIS — Z23 Encounter for immunization: Secondary | ICD-10-CM | POA: Diagnosis not present

## 2017-08-12 ENCOUNTER — Other Ambulatory Visit: Payer: Self-pay | Admitting: Family Medicine

## 2017-10-09 ENCOUNTER — Other Ambulatory Visit: Payer: Medicare Other

## 2017-10-09 ENCOUNTER — Other Ambulatory Visit (HOSPITAL_COMMUNITY)
Admission: RE | Admit: 2017-10-09 | Discharge: 2017-10-09 | Disposition: A | Discharge status: Home / Self Care | Payer: Medicare Other | Source: Ambulatory Visit | Attending: Obstetrics and Gynecology | Admitting: Obstetrics and Gynecology

## 2017-10-09 VITALS — BP 112/71 | HR 89

## 2017-10-09 DIAGNOSIS — N898 Other specified noninflammatory disorders of vagina: Secondary | ICD-10-CM | POA: Insufficient documentation

## 2017-10-09 NOTE — Progress Notes (Signed)
Patient presented to the office today for a self swab. She report having some vaginal itching and some discharge. Instructed patient on how to self swab. Culture label and collected and sent to the lab. Informed patient we will call her when results come back.

## 2017-10-10 LAB — CERVICOVAGINAL ANCILLARY ONLY
BACTERIAL VAGINITIS: NEGATIVE
Candida vaginitis: NEGATIVE
TRICH (WINDOWPATH): NEGATIVE

## 2017-10-11 ENCOUNTER — Other Ambulatory Visit: Payer: Self-pay

## 2017-10-11 DIAGNOSIS — B373 Candidiasis of vulva and vagina: Secondary | ICD-10-CM

## 2017-10-11 DIAGNOSIS — B3731 Acute candidiasis of vulva and vagina: Secondary | ICD-10-CM

## 2017-10-11 MED ORDER — NYSTATIN 100000 UNIT/GM EX CREA: 1.0000 "application " | TOPICAL_CREAM | Freq: Two times a day (BID) | CUTANEOUS | 0 refills | Status: DC

## 2017-10-11 NOTE — Telephone Encounter (Signed)
Patient culture came back negative for yeast and bv. Patient is requesting some nystatin be refill for her.[facility] [address]

## 2017-12-25 ENCOUNTER — Other Ambulatory Visit: Payer: Self-pay | Admitting: Family Medicine

## 2017-12-26 NOTE — Telephone Encounter (Signed)
Last office visit 01/23/2017.  Not on current medication list.  Refill?

## 2018-01-18 ENCOUNTER — Other Ambulatory Visit: Payer: Medicare Other

## 2018-01-22 ENCOUNTER — Other Ambulatory Visit: Payer: Medicare Other

## 2018-01-25 ENCOUNTER — Encounter: Payer: Medicare Other | Admitting: Family Medicine

## 2018-01-31 ENCOUNTER — Telehealth: Payer: Self-pay | Admitting: Family Medicine

## 2018-01-31 DIAGNOSIS — E7800 Pure hypercholesterolemia, unspecified: Secondary | ICD-10-CM

## 2018-01-31 NOTE — Telephone Encounter (Signed)
-----   Message from Alvina Chou sent at 01/24/2018 10:36 AM EDT ----- Regarding: Lab orders for Friday, 4.12.19 Patient is scheduled for CPX labs, please order future labs, Thanks , Camelia Eng

## 2018-02-01 ENCOUNTER — Other Ambulatory Visit: Payer: Self-pay | Admitting: Family Medicine

## 2018-02-01 ENCOUNTER — Other Ambulatory Visit (INDEPENDENT_AMBULATORY_CARE_PROVIDER_SITE_OTHER): Payer: Medicare Other

## 2018-02-01 DIAGNOSIS — E7800 Pure hypercholesterolemia, unspecified: Secondary | ICD-10-CM

## 2018-02-01 LAB — COMPREHENSIVE METABOLIC PANEL
ALK PHOS: 76 U/L (ref 39–117)
ALT: 15 U/L (ref 0–35)
AST: 16 U/L (ref 0–37)
Albumin: 4.1 g/dL (ref 3.5–5.2)
BILIRUBIN TOTAL: 0.4 mg/dL (ref 0.2–1.2)
BUN: 15 mg/dL (ref 6–23)
CO2: 31 mEq/L (ref 19–32)
CREATININE: 0.8 mg/dL (ref 0.40–1.20)
Calcium: 9.2 mg/dL (ref 8.4–10.5)
Chloride: 103 mEq/L (ref 96–112)
GFR: 75.11 mL/min (ref >60.00)
GLUCOSE: 87 mg/dL (ref 70–99)
Potassium: 4.3 mEq/L (ref 3.5–5.1)
Sodium: 139 mEq/L (ref 135–145)
TOTAL PROTEIN: 7.3 g/dL (ref 6.0–8.3)

## 2018-02-01 LAB — LIPID PANEL
Cholesterol: 190 mg/dL (ref 0–200)
HDL: 74.1 mg/dL (ref >39.00)
LDL Cholesterol: 95 mg/dL (ref 0–99)
NONHDL: 115.53
Total CHOL/HDL Ratio: 3
Triglycerides: 103 mg/dL (ref 0.0–149.0)
VLDL: 20.6 mg/dL (ref 0.0–40.0)

## 2018-02-05 ENCOUNTER — Encounter: Payer: Medicare Other | Admitting: Family Medicine

## 2018-02-12 ENCOUNTER — Ambulatory Visit (INDEPENDENT_AMBULATORY_CARE_PROVIDER_SITE_OTHER): Payer: Medicare Other | Admitting: Family Medicine

## 2018-02-12 ENCOUNTER — Encounter: Payer: Self-pay | Admitting: Family Medicine

## 2018-02-12 VITALS — BP 122/76 | HR 66 | Temp 97.8°F | Ht 64.75 in | Wt 127.5 lb

## 2018-02-12 DIAGNOSIS — M0579 Rheumatoid arthritis with rheumatoid factor of multiple sites without organ or systems involvement: Secondary | ICD-10-CM

## 2018-02-12 DIAGNOSIS — I10 Essential (primary) hypertension: Secondary | ICD-10-CM

## 2018-02-12 DIAGNOSIS — E7800 Pure hypercholesterolemia, unspecified: Secondary | ICD-10-CM

## 2018-02-12 DIAGNOSIS — Z0000 Encounter for general adult medical examination without abnormal findings: Secondary | ICD-10-CM

## 2018-02-12 NOTE — Assessment & Plan Note (Signed)
Dx 2016 on methotrexate.  Mainly in knees and hand.  Followed by Dr. Nickola Major.

## 2018-02-12 NOTE — Patient Instructions (Addendum)
Call to set up mammogram on your own. Complete cologuard when ready.  Increase water, fiber and use Miralax daily to as needed for control.

## 2018-02-12 NOTE — Progress Notes (Signed)
Subjective:    Patient ID: Erica Kirk, female    DOB: [birth year], 72 y.o.   MRN: [id number]  HPI  The patient presents for annual medicare wellness, complete physical and review of chronic health problems.   I have personally reviewed the Medicare Annual Wellness questionnaire and have noted 1. The patient's medical and social history 2. Their use of alcohol, tobacco or illicit drugs 3. Their current medications and supplements 4. The patient's functional ability including ADL's, fall risks, home safety risks and hearing or visual             impairment. 5. Diet and physical activities 6. Evidence for depression or mood disorders 7.         Updated provider list .Cognitive evaluation was performed and recorded on pt medicare questionnaire form. The patients weight, height, BMI and visual acuity have been recorded in the chart  I have made referrals, counseling and provided education to the patient based review of the above and I have provided the pt with a written personalized care plan for preventive services.   Documentation of this information was scanned into the electronic record under the media tab.  RA, moderate control on methotrexate.  Followed by rheum, Dr. Nickola Major. Seeing every 3 months.  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  Hypertension:   Good control on verapamil..  BP Readings from Last 3 Encounters:  02/12/18 122/76  10/09/17 112/71  01/23/17 (!) 100/58  Using medication without problems or lightheadedness: none Chest pain with exertion: None  Edema: none Short of breath: none Average home BPs: not checking at home. Other issues:  Elevated Cholesterol:   Good control on no medication. 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   Using triamcinolone as needed for severe reaction to bug bitews.   Hearing Screening   125Hz  250Hz  500Hz  1000Hz  2000Hz  3000Hz  4000Hz  6000Hz  8000Hz   Right ear:   40 40 20  20    Left ear:   40 40 40  40      Visual Acuity Screening   Right eye Left eye Both eyes  Without correction:     With correction: 20/20 20/20 20/20    Fall Risk  02/12/2018 01/23/2017  Falls in the past year? No Yes  Injury with Fall? - Yes    Advance directives and end of life planning reviewed in detail with patient and documented in EMR. Patient given handout on advance care directives if needed. HCPOA and living will updated if needed.  Depression screen [facility] 2/9 02/12/2018 01/23/2017  Decreased Interest 0 -  Down, Depressed, Hopeless 0 0  PHQ - 2 Score 0 0    Social History /Family History/Past Medical History reviewed in detail and updated in EMR if needed. Blood pressure 122/76, pulse 66, temperature 97.8 F (36.6 C), temperature source Oral, height 5' 4.75" (1.645 m), weight 127 lb 8 oz (57.8 kg), SpO2 97 %.  Review of Systems  Constitutional: Negative for fatigue and fever.  HENT: Negative for congestion.   Eyes: Negative for pain.  Respiratory: Negative for cough and shortness of breath.   Cardiovascular: Negative for  chest pain, palpitations and leg swelling.  Gastrointestinal: Positive for constipation. Negative for abdominal pain.  Genitourinary: Negative for dysuria and vaginal bleeding.  Musculoskeletal: Negative for back pain.  Neurological: Negative for syncope, light-headedness and headaches.  Psychiatric/Behavioral: Negative for dysphoric mood.       Objective:   Physical Exam  Constitutional: Vital signs are normal. She appears well-developed and well-nourished. She is cooperative.  Non-toxic appearance. She does not appear ill. No  distress.  Thin appearing female in NAd  HENT:  Head: Normocephalic.  Right Ear: Hearing, tympanic membrane, external ear and ear canal normal.  Left Ear: Hearing, tympanic membrane, external ear and ear canal normal.  Nose: Nose normal.  Eyes: Pupils are equal, round, and reactive to light. Conjunctivae, EOM and lids are normal. Lids are everted and swept, no foreign bodies found.  Neck: Trachea normal and normal range of motion. Neck supple. Carotid bruit is not present. No thyroid mass and no thyromegaly present.  Cardiovascular: Normal rate, regular rhythm, S1 normal, S2 normal, normal heart sounds and intact distal pulses. Exam reveals no gallop.  No murmur heard. Pulmonary/Chest: Effort normal and breath sounds normal. No respiratory distress. She has no wheezes. She has no rhonchi. She has no rales.  Abdominal: Soft. Normal appearance and bowel sounds are normal. She exhibits no distension, no fluid wave, no abdominal bruit and no mass. There is no hepatosplenomegaly. There is no tenderness. There is no rebound, no guarding and no CVA tenderness. No hernia.  Lymphadenopathy:    She has no cervical adenopathy.    She has no axillary adenopathy.  Neurological: She is alert. She has normal strength. No cranial nerve deficit or sensory deficit.  Skin: Skin is warm, dry and intact. No rash noted.  Psychiatric: Her speech is normal and behavior is normal. Judgment normal. Her mood appears not anxious. Cognition and memory are normal. She does not exhibit a depressed mood.          Assessment & Plan:  The patient's preventative maintenance and recommended screening tests for an annual wellness exam were reviewed in full today. Brought up to date unless services declined.  Counselled on the importance of diet, exercise, and its role in overall health and mortality. The patient's FH and SH was reviewed, including their home life, tobacco status, and drug and alcohol status.   Vaccines:  Uptodate . Plans shingles when availale Pap/DVE:  No  indication for pap or DVE.  No symptoms and no family history of ovarian and uterine cancer. Pt agreeable. Mammo: 02/01/2016, plan q2 years. Due now Bone Density: 02/06/2017  stable osteoporosis.  Not interested in medication to treat. Colon:04/16/2008, nml.  Given cologuard 2019  Smoking Status: nonsmoker ETOH/ drug IPJ:ASNK Hep C:   done

## 2018-02-12 NOTE — Assessment & Plan Note (Signed)
Well controlled. Continue current medication.  

## 2018-04-03 LAB — COLOGUARD: Cologuard: NEGATIVE

## 2018-04-16 ENCOUNTER — Encounter: Payer: Self-pay | Admitting: Family Medicine

## 2018-05-06 ENCOUNTER — Other Ambulatory Visit: Payer: Self-pay | Admitting: Family Medicine

## 2018-05-07 ENCOUNTER — Other Ambulatory Visit: Payer: Self-pay | Admitting: Family Medicine

## 2018-06-11 ENCOUNTER — Telehealth: Payer: Self-pay | Admitting: Family Medicine

## 2018-06-11 NOTE — Telephone Encounter (Signed)
Copied from CRM [id number]. Topic: Quick Communication - Rx Refill/Question >> Jun 11, 2018  8:24 AM Crist Infante wrote: Medication: diflucan  Pt thinks she has a yeast infection.  Pt has itching, redness, a little discharge, and would like Dr Ermalene Searing to call in this med for her. Pt has moved to [place], [place].  Pt declined an appt.  Pt request to ask the doctor.  [facility]  [address], [place],  [place]  [address] [phone]

## 2018-06-11 NOTE — Telephone Encounter (Signed)
If recent antibiotics.Molli Knock to call in diflucan 150 mg x 1 dose. If none.. Pt needs appt to be seen or can first try OTC monistat

## 2018-06-11 NOTE — Telephone Encounter (Signed)
Erica Kirk notified as instructed by telephone.  She has not taken antibiotics recently.  She will try OTC Monistat.

## 2018-06-11 NOTE — Telephone Encounter (Signed)
Pt had annual exam 02/12/18.Please advise.

## 2018-08-16 ENCOUNTER — Other Ambulatory Visit: Payer: Self-pay | Admitting: *Deleted

## 2018-08-16 MED ORDER — TRIAMCINOLONE ACETONIDE 0.1 % EX CREA: 1.0000 "application " | TOPICAL_CREAM | Freq: Two times a day (BID) | CUTANEOUS | 0 refills | Status: DC

## 2018-08-16 NOTE — Telephone Encounter (Signed)
Last office visit 02/12/2018 for Medicare Wellness.  CPE scheduled 02/28/2019.  Last refilled 06/15/2017 for 30 g with no refills.  Ok to refill?

## 2018-12-15 ENCOUNTER — Other Ambulatory Visit: Payer: Self-pay | Admitting: Family Medicine

## 2018-12-16 NOTE — Telephone Encounter (Signed)
Last office visit 02/02/2018 for [facility].  Last refilled 08/16/2018 for 30 g.  No future appointments.

## 2018-12-23 ENCOUNTER — Other Ambulatory Visit: Payer: Self-pay | Admitting: Family Medicine

## 2019-02-21 ENCOUNTER — Ambulatory Visit: Payer: Medicare Other

## 2019-02-28 ENCOUNTER — Encounter: Payer: Medicare Other | Admitting: Family Medicine

## 2019-04-24 ENCOUNTER — Other Ambulatory Visit: Payer: Self-pay | Admitting: Family Medicine

## 2019-04-24 NOTE — Telephone Encounter (Signed)
Last office visit 02/12/2018 for [place].  Phone note from 06/11/2018 states patient has moved to [place].  No future appointments.  Refill?

## 2019-04-25 ENCOUNTER — Other Ambulatory Visit: Payer: Self-pay | Admitting: Family Medicine

## 2019-04-28 NOTE — Telephone Encounter (Signed)
Last office visit 02/12/2018 for [place].  Last refilled 12/16/2018 for 30 g with no refills.  No future appointments.

## 2019-05-01 ENCOUNTER — Encounter: Payer: Self-pay | Admitting: *Deleted

## 2019-05-01 NOTE — Telephone Encounter (Signed)
This encounter was created in error - please disregard.

## 2019-05-23 ENCOUNTER — Other Ambulatory Visit: Payer: Self-pay | Admitting: Family Medicine

## 2019-09-04 ENCOUNTER — Other Ambulatory Visit: Payer: Self-pay | Admitting: Family Medicine

## 2019-09-12 ENCOUNTER — Other Ambulatory Visit: Payer: Self-pay | Admitting: Family Medicine

## 2021-04-20 LAB — COLOGUARD: COLOGUARD: NEGATIVE

## 2023-03-30 ENCOUNTER — Telehealth: Payer: Self-pay | Admitting: Family Medicine

## 2023-03-30 NOTE — Telephone Encounter (Signed)
Called pt, unable to leave voicemail. Says mychart was last active in 2020 so sent a mychart message to see if there would be a response.

## 2023-03-30 NOTE — Telephone Encounter (Signed)
Pt called in wants to know can she still be seen by PCP last appointment was in 2020 . Please advise [phone]

## 2023-03-30 NOTE — Telephone Encounter (Signed)
Okay to schedule with me but needs to be as new patient in new patient slot.  Only 1 new patient/transfer care per week

## 2023-03-30 NOTE — Telephone Encounter (Signed)
Patient returned call, scheduled new pt visit for 8/27

## 2023-06-11 NOTE — Telephone Encounter (Signed)
Pt returned call to confirm appointment.

## 2023-06-19 ENCOUNTER — Ambulatory Visit: Payer: Medicare PPO | Admitting: Family Medicine

## 2023-06-19 ENCOUNTER — Encounter: Payer: Self-pay | Admitting: Family Medicine

## 2023-06-19 VITALS — BP 120/80 | HR 71 | Temp 98.5°F | Ht 65.0 in | Wt 104.4 lb

## 2023-06-19 DIAGNOSIS — M810 Age-related osteoporosis without current pathological fracture: Secondary | ICD-10-CM

## 2023-06-19 DIAGNOSIS — K5909 Other constipation: Secondary | ICD-10-CM

## 2023-06-19 DIAGNOSIS — K219 Gastro-esophageal reflux disease without esophagitis: Secondary | ICD-10-CM

## 2023-06-19 DIAGNOSIS — G90512 Complex regional pain syndrome I of left upper limb: Secondary | ICD-10-CM | POA: Diagnosis not present

## 2023-06-19 DIAGNOSIS — M0579 Rheumatoid arthritis with rheumatoid factor of multiple sites without organ or systems involvement: Secondary | ICD-10-CM | POA: Diagnosis not present

## 2023-06-19 DIAGNOSIS — I10 Essential (primary) hypertension: Secondary | ICD-10-CM

## 2023-06-19 DIAGNOSIS — E7800 Pure hypercholesterolemia, unspecified: Secondary | ICD-10-CM

## 2023-06-19 NOTE — Progress Notes (Signed)
Patient ID: Erica Kirk, female    DOB: [birth year], 77 y.o.   MRN: [id number]  This visit was conducted in person.  BP 120/80 (BP Location: Right Arm, Patient Position: Sitting, Cuff Size: Normal)   Pulse 71   Temp 98.5 F (36.9 C) (Temporal)   Ht 5\' 5"  (1.651 m)   Wt 104 lb 6 oz (47.3 kg)   SpO2 98%   BMI 17.37 kg/m    CC:  Chief Complaint  Patient presents with   Re-Establish Care    Subjective:   HPI: Erica Kirk is a 77 y.o. female presenting on 06/19/2023 for Re-Establish Care   Last CPX: 06/05/2023  Previous PCP   Dr.Dawson  [facility]  Was living in [place], [place]. Lived there for 5 years... came to be back with grandkids.  Last OV with me 2019    RA followed  by rheumatology yearly. Stable on plaquenil. Plans to reestablish locally.  Walking 3 days a week. Lifting weights.   HTN  well controlled on verpamil BP Readings from Last 3 Encounters:  06/19/23 120/80  02/12/18 122/76  10/09/17 112/71      GERD and history of gastritis and constipation:  Using miralax and pepcid daily daily.  She has had to eat less to avoid these issues... has stabilized now. Wt Readings from Last 3 Encounters:  06/19/23 104 lb 6 oz (47.3 kg)  02/12/18 127 lb 8 oz (57.8 kg)  01/23/17 122 lb 12 oz (55.7 kg)  Body mass index is 17.37 kg/m.    Relevant past medical, surgical, family and social history reviewed and updated as indicated. Interim medical history since our last visit reviewed. Allergies and medications reviewed and updated. Outpatient Medications Prior to Visit  Medication Sig Dispense Refill   azelastine (ASTELIN) 0.1 % nasal spray Place 1 spray into both nostrils daily as needed.     Cholecalciferol (VITAMIN D) 2000 units tablet Take 2,000 Units by mouth once.     famotidine (PEPCID) 20 MG tablet Take 20 mg by mouth daily.     fluticasone (FLONASE) 50 MCG/ACT nasal spray Place 2 sprays into both nostrils daily as needed for allergies or rhinitis.      hydrocortisone 2.5 % cream Apply 1 application topically 2 (two) times daily.     hydroxychloroquine (PLAQUENIL) 200 MG tablet Alternating 400 mg and 200 mg daily     nystatin cream (MYCOSTATIN) Apply 1 application topically 2 (two) times daily. 30 g 0   polyethylene glycol powder (GLYCOLAX/MIRALAX) 17 GM/SCOOP powder Take by mouth daily.     PROCTOSOL HC 2.5 % rectal cream PLACE 1 APPLICATION RECTALLY 2 (TWO) TIMES DAILY. 28.35 g 1   triamcinolone cream (KENALOG) 0.1 % Apply 1 Application topically 2 (two) times daily as needed.     verapamil (VERELAN PM) 120 MG 24 hr capsule TAKE 1 CAPSULE BY MOUTH DAILY 90 capsule 0   fluticasone (FLONASE) 50 MCG/ACT nasal spray SPRAY 2 SPRAYS INTO EACH NOSTRIL EVERY DAY 48 g 3   triamcinolone cream (KENALOG) 0.1 % APPLY TO AFFECTED AREA TWICE A DAY (Patient taking differently: Apply 1 Application topically 2 (two) times daily as needed.) 30 g 0   folic acid (FOLVITE) 1 MG tablet Take 2 mg by mouth daily.      methotrexate (50 MG/ML) 1 g injection Inject 0.8 mg into the vein once.     Probiotic Product (PROBIOTIC DAILY PO) Take 1 tablet by mouth daily. [employer] Probiotics with Enzymes  vitamin A 47829 UNIT capsule Take 25,000 Units by mouth daily.     No facility-administered medications prior to visit.     Per HPI unless specifically indicated in ROS section below Review of Systems  Constitutional:  Negative for fatigue and fever.  HENT:  Negative for congestion.   Eyes:  Negative for pain.  Respiratory:  Negative for cough and shortness of breath.   Cardiovascular:  Negative for chest pain, palpitations and leg swelling.  Gastrointestinal:  Negative for abdominal pain.  Genitourinary:  Negative for dysuria and vaginal bleeding.  Musculoskeletal:  Negative for back pain.  Neurological:  Negative for syncope, light-headedness and headaches.  Psychiatric/Behavioral:  Negative for dysphoric mood.    Objective:  BP 120/80 (BP Location: Right Arm, Patient  Position: Sitting, Cuff Size: Normal)   Pulse 71   Temp 98.5 F (36.9 C) (Temporal)   Ht 5\' 5"  (1.651 m)   Wt 104 lb 6 oz (47.3 kg)   SpO2 98%   BMI 17.37 kg/m   Wt Readings from Last 3 Encounters:  06/19/23 104 lb 6 oz (47.3 kg)  02/12/18 127 lb 8 oz (57.8 kg)  01/23/17 122 lb 12 oz (55.7 kg)      Physical Exam Vitals and nursing note reviewed.  Constitutional:      General: She is not in acute distress.    Appearance: Normal appearance. She is well-developed. She is not ill-appearing or toxic-appearing.  HENT:     Head: Normocephalic.     Right Ear: Hearing, tympanic membrane, ear canal and external ear normal.     Left Ear: Hearing, tympanic membrane, ear canal and external ear normal.     Nose: Nose normal.  Eyes:     General: Lids are normal. Lids are everted, no foreign bodies appreciated.     Conjunctiva/sclera: Conjunctivae normal.     Pupils: Pupils are equal, round, and reactive to light.  Neck:     Thyroid: No thyroid mass or thyromegaly.     Vascular: No carotid bruit.     Trachea: Trachea normal.  Cardiovascular:     Rate and Rhythm: Normal rate and regular rhythm.     Heart sounds: Normal heart sounds, S1 normal and S2 normal. No murmur heard.    No gallop.  Pulmonary:     Effort: Pulmonary effort is normal. No respiratory distress.     Breath sounds: Normal breath sounds. No wheezing, rhonchi or rales.  Abdominal:     General: Bowel sounds are normal. There is no distension or abdominal bruit.     Palpations: Abdomen is soft. There is no fluid wave or mass.     Tenderness: There is no abdominal tenderness. There is no guarding or rebound.     Hernia: No hernia is present.  Musculoskeletal:     Cervical back: Normal range of motion and neck supple.  Lymphadenopathy:     Cervical: No cervical adenopathy.  Skin:    General: Skin is warm and dry.     Findings: No rash.  Neurological:     Mental Status: She is alert.     Cranial Nerves: No cranial nerve  deficit.     Sensory: No sensory deficit.  Psychiatric:        Mood and Affect: Mood is not anxious or depressed.        Speech: Speech normal.        Behavior: Behavior normal. Behavior is cooperative.        Judgment:  Judgment normal.       Results for orders placed or performed in visit on 04/16/18  Cologuard  Result Value Ref Range   Cologuard Negative     Assessment and Plan  Rheumatoid arthritis involving multiple sites with positive rheumatoid factor (HCC) Assessment & Plan: Stable, chronic.  Continue current medication.  Referral placed for local rheumatology.   Following with eye exam yearly.    Orders: -     Ambulatory referral to Rheumatology  Chronic constipation Assessment & Plan: Stable, chronic.  Continue current medication.   Miralax 17 gm daily.   Gastroesophageal reflux disease without esophagitis Assessment & Plan: Stable, chronic.  Continue current medication.   Pepcid daily.   Complex regional pain syndrome type 1 of left upper extremity Assessment & Plan: Resulted after breaking left wrist 2010.  Cannot hold left hand open  No pain. Has seen hand doctor in past.   Primary hypertension Assessment & Plan: Well controlled. Continue current medication.   Verapamil 120 mg daily   High cholesterol Assessment & Plan:  Chronic, diet controlled.  High HDL, protective.    Age related osteoporosis, unspecified pathological fracture presence Assessment & Plan: Recommend weight bearing exercise, calcium in diet and vit D supplement 400 IU 1-2 times daily. Not interested in  med to treat.     Return in about 1 year (around 06/18/2024) for phone AMW,  fasting labs then CPE with me.   Kerby Nora, MD

## 2023-06-19 NOTE — Assessment & Plan Note (Signed)
Recommend weight bearing exercise, calcium in diet and vit D supplement 400 IU 1-2 times daily. Not interested in  med to treat.

## 2023-06-19 NOTE — Assessment & Plan Note (Signed)
Stable, chronic.  Continue current medication.   Miralax 17 gm daily.

## 2023-06-19 NOTE — Assessment & Plan Note (Signed)
Resulted after breaking left wrist 2010.  Cannot hold left hand open  No pain. Has seen hand doctor in past.

## 2023-06-19 NOTE — Assessment & Plan Note (Signed)
Well controlled. Continue current medication.   Verapamil 120 mg daily

## 2023-06-19 NOTE — Assessment & Plan Note (Signed)
Chronic, diet controlled.  High HDL, protective.

## 2023-06-19 NOTE — Assessment & Plan Note (Signed)
Stable, chronic.  Continue current medication.   Pepcid daily.

## 2023-06-19 NOTE — Assessment & Plan Note (Signed)
Stable, chronic.  Continue current medication.  Referral placed for local rheumatology.   Following with eye exam yearly.

## 2023-06-22 ENCOUNTER — Encounter: Payer: Self-pay | Admitting: *Deleted

## 2023-07-05 ENCOUNTER — Ambulatory Visit: Payer: Medicare PPO | Admitting: Internal Medicine

## 2023-07-05 ENCOUNTER — Encounter: Payer: Self-pay | Admitting: Internal Medicine

## 2023-07-05 VITALS — BP 102/78 | HR 88 | Temp 97.2°F | Ht 65.0 in | Wt 105.0 lb

## 2023-07-05 DIAGNOSIS — U071 COVID-19: Secondary | ICD-10-CM | POA: Diagnosis not present

## 2023-07-05 DIAGNOSIS — R0981 Nasal congestion: Secondary | ICD-10-CM

## 2023-07-05 LAB — POC COVID19 BINAXNOW: SARS Coronavirus 2 Ag: POSITIVE — AB

## 2023-07-05 NOTE — Progress Notes (Signed)
Subjective:    Patient ID: Erica Kirk, female    DOB: [birth year], 77 y.o.   MRN: [id number]  HPI Here due to respiratory infection  Started 3 days ago---headache and sore throat Really hit her 2 days ago Having pain in facial bones/teeth/ears Fever up to 102 Achy all over--staying in bed Clear nasal drainage Little cough No SOB  Tried afrin twice---stopped now No other medications  Current Outpatient Medications on File Prior to Visit  Medication Sig Dispense Refill   azelastine (ASTELIN) 0.1 % nasal spray Place 1 spray into both nostrils daily as needed.     Cholecalciferol (VITAMIN D) 2000 units tablet Take 2,000 Units by mouth once.     famotidine (PEPCID) 20 MG tablet Take 20 mg by mouth daily.     fluticasone (FLONASE) 50 MCG/ACT nasal spray Place 2 sprays into both nostrils daily as needed for allergies or rhinitis.     hydrocortisone 2.5 % cream Apply 1 application topically 2 (two) times daily.     hydroxychloroquine (PLAQUENIL) 200 MG tablet Alternating 400 mg and 200 mg daily     nystatin cream (MYCOSTATIN) Apply 1 application topically 2 (two) times daily. 30 g 0   polyethylene glycol powder (GLYCOLAX/MIRALAX) 17 GM/SCOOP powder Take by mouth daily.     PROCTOSOL HC 2.5 % rectal cream PLACE 1 APPLICATION RECTALLY 2 (TWO) TIMES DAILY. 28.35 g 1   triamcinolone cream (KENALOG) 0.1 % Apply 1 Application topically 2 (two) times daily as needed.     verapamil (VERELAN PM) 120 MG 24 hr capsule TAKE 1 CAPSULE BY MOUTH DAILY 90 capsule 0   No current facility-administered medications on file prior to visit.    Allergies  Allergen Reactions   Demerol [Meperidine] Other (See Comments)    Passed out   Nexium [Esomeprazole Magnesium] Rash    Past Medical History:  Diagnosis Date   Arthritis    GERD (gastroesophageal reflux disease)    Hyperlipidemia    Hypertension    Mammogram abnormal    Osteoporosis    RA (rheumatoid arthritis) (HCC)    Vertigo  10/23/2013    Past Surgical History:  Procedure Laterality Date   BLADDER SUSPENSION  04/21/08   BREAST SURGERY     COLONOSCOPY  04/16/08   COSMETIC SURGERY  08/06/00   ESOPHAGOGASTRODUODENOSCOPY ENDOSCOPY  12/06/11   SHOULDER SURGERY Left 09/20/05    Family History  Problem Relation Age of Onset   Lung disease Father    Cancer Father    Heart disease Father    Rheum arthritis Paternal Uncle     Social History   Socioeconomic History   Marital status: Unknown    Spouse name: Not on file   Number of children: Not on file   Years of education: Not on file   Highest education level: Not on file  Occupational History   Not on file  Tobacco Use   Smoking status: Never   Smokeless tobacco: Never  Substance and Sexual Activity   Alcohol use: Yes    Comment: social   Drug use: No   Sexual activity: Yes  Other Topics Concern   Not on file  Social History Narrative   3 sons, one deceased    Other children live in [place].    Diet and exercise: good, walks few times a week.   Retired Runner, broadcasting/film/video    Married   Social Determinants of Corporate investment banker Strain: Low Risk  (06/05/2023)  Received from [facility]   Overall Financial Resource Strain (CARDIA)    Difficulty of Paying Living Expenses: Not hard at all  Food Insecurity: No Food Insecurity (06/05/2023)   [facility]   Hunger Vital Sign    Worried About Running Out of Food in the Last Year: Never true    Ran Out of Food in the Last Year: Never true  Transportation Needs: No Transportation Needs (06/05/2023)   Received from [facility] - Transportation    Lack of Transportation (Medical): No    Lack of Transportation (Non-Medical): No  Physical Activity: Insufficiently Active (06/05/2023)   Received from [facility]   Exercise Vital Sign    Days of Exercise per Week: 1 day    Minutes of Exercise per Session: 60 min  Stress: No Stress Concern Present (06/05/2023)   Received  from [facility] - Occupational Stress Questionnaire    Feeling of Stress : Not at all  Social Connections: Socially Integrated (06/05/2023)   Received from [facility]   Social Network    How would you rate your social network (family, work, friends)?: Good participation with social networks  Intimate Partner Violence: Not At Risk (06/05/2023)   Received from [facility]   HITS    Over the last 12 months how often did your partner physically hurt you?: 1    Over the last 12 months how often did your partner insult you or talk down to you?: 1    Over the last 12 months how often did your partner threaten you with physical harm?: 1    Over the last 12 months how often did your partner scream or curse at you?: 1   Review of Systems Appetite off--able to have some toast this morning Taste is gone Slight nausea briefly--but no vomiting    Objective:   Physical Exam Constitutional:      Appearance: Normal appearance.  HENT:     Head:     Comments: Maxillary tenderness    Right Ear: Tympanic membrane and ear canal normal.     Left Ear: Tympanic membrane and ear canal normal.     Mouth/Throat:     Pharynx: No oropharyngeal exudate or posterior oropharyngeal erythema.  Pulmonary:     Effort: Pulmonary effort is normal.     Breath sounds: Normal breath sounds. No wheezing or rales.  Musculoskeletal:     Cervical back: Neck supple.  Lymphadenopathy:     Cervical: No cervical adenopathy.  Neurological:     Mental Status: She is alert.            Assessment & Plan:

## 2023-07-05 NOTE — Assessment & Plan Note (Addendum)
Feeling some better today Discussed antivirals--she would like to hold off Sinus symptoms predominate---continue flonase/azelastine---and can try empiric antibiotic next week if worsens Tylenol for pain ER if SOB, etc Isolate till next week--then mask

## 2023-07-19 ENCOUNTER — Ambulatory Visit: Payer: Medicare PPO | Admitting: Family Medicine

## 2023-07-19 ENCOUNTER — Encounter: Payer: Self-pay | Admitting: Family Medicine

## 2023-07-19 VITALS — BP 118/84 | HR 65 | Temp 98.0°F | Ht 65.0 in | Wt 101.5 lb

## 2023-07-19 DIAGNOSIS — R21 Rash and other nonspecific skin eruption: Secondary | ICD-10-CM

## 2023-07-19 DIAGNOSIS — J309 Allergic rhinitis, unspecified: Secondary | ICD-10-CM

## 2023-07-19 MED ORDER — PREDNISONE 10 MG PO TABS: ORAL_TABLET | ORAL | 0 refills | Status: AC

## 2023-07-19 NOTE — Patient Instructions (Signed)
Start  Zyrtec or Xyzal at bedtime.  Start nasal  saline spray 2 times daily. MAke sure to use before the Azelastine spray.  If not improving, complete prednsione taper.

## 2023-07-19 NOTE — Progress Notes (Signed)
Patient ID: Erica Kirk, female    DOB: 1946-05-28, 77 y.o.   MRN: [id number]  This visit was conducted in person.  BP 118/84   Pulse 65   Temp 98 F (36.7 C) (Temporal)   Ht 5\' 5"  (1.651 m)   Wt 101 lb 8 oz (46 kg)   SpO2 98%   BMI 16.89 kg/m    CC:  Chief Complaint  Patient presents with   Rash    C/o rash allover back. Thinks may be allergic reaction. Started 07/15/23. Area itches. H/o rash off and on. New from [place], [place] and was supposed to see allergist but never did.     Subjective:   HPI: Erica Kirk is a 77 y.o. female presenting on 07/19/2023 for Rash (C/o rash allover back. Thinks may be allergic reaction. Started 07/15/23. Area itches. H/o rash off and on. New from [place], [place] and was supposed to see allergist but never did. )    Recent  COVID infection. Symptoms improved until last 4 days.Marland Kitchen started with pain behind right eye and right face, right ear pain.  No fever.  Noted new rash on right back very itchy.  Used hydrocortisone then triamcinolone.. started to improve.  Tried benadryl.  Still some mild pain behind eye but much better.  Still nasal congestion.    History of recurrent rash in past.  Last year given famotidine... cause rash on left side of back... rash stopped when she stopped the medication. Saw allergist in past... noted deviated septum on left side.        Relevant past medical, surgical, family and social history reviewed and updated as indicated. Interim medical history since our last visit reviewed. Allergies and medications reviewed and updated. Outpatient Medications Prior to Visit  Medication Sig Dispense Refill   azelastine (ASTELIN) 0.1 % nasal spray Place 1 spray into both nostrils daily as needed.     Cholecalciferol (VITAMIN D) 2000 units tablet Take 2,000 Units by mouth once.     famotidine (PEPCID) 20 MG tablet Take 20 mg by mouth daily.     fluticasone (FLONASE) 50 MCG/ACT nasal spray Place 2 sprays into  both nostrils daily as needed for allergies or rhinitis.     hydrocortisone 2.5 % cream Apply 1 application topically 2 (two) times daily.     hydroxychloroquine (PLAQUENIL) 200 MG tablet Take 200 mg by mouth daily.     nystatin cream (MYCOSTATIN) Apply 1 application topically 2 (two) times daily. 30 g 0   polyethylene glycol powder (GLYCOLAX/MIRALAX) 17 GM/SCOOP powder Take by mouth daily.     PROCTOSOL HC 2.5 % rectal cream PLACE 1 APPLICATION RECTALLY 2 (TWO) TIMES DAILY. 28.35 g 1   triamcinolone cream (KENALOG) 0.1 % Apply 1 Application topically 2 (two) times daily as needed.     verapamil (VERELAN PM) 120 MG 24 hr capsule TAKE 1 CAPSULE BY MOUTH DAILY 90 capsule 0   No facility-administered medications prior to visit.     Per HPI unless specifically indicated in ROS section below Review of Systems  Constitutional:  Negative for fatigue and fever.  HENT:  Negative for congestion.   Eyes:  Negative for pain.  Respiratory:  Negative for cough and shortness of breath.   Cardiovascular:  Negative for chest pain, palpitations and leg swelling.  Gastrointestinal:  Negative for abdominal pain.  Genitourinary:  Negative for dysuria and vaginal bleeding.  Musculoskeletal:  Negative for back pain.  Neurological:  Negative for syncope,  light-headedness and headaches.  Psychiatric/Behavioral:  Negative for dysphoric mood.    Objective:  BP 118/84   Pulse 65   Temp 98 F (36.7 C) (Temporal)   Ht 5\' 5"  (1.651 m)   Wt 101 lb 8 oz (46 kg)   SpO2 98%   BMI 16.89 kg/m   Wt Readings from Last 3 Encounters:  07/19/23 101 lb 8 oz (46 kg)  07/05/23 105 lb (47.6 kg)  06/19/23 104 lb 6 oz (47.3 kg)      Physical Exam Constitutional:      General: She is not in acute distress.    Appearance: Normal appearance. She is well-developed. She is not ill-appearing or toxic-appearing.  HENT:     Head: Normocephalic.     Right Ear: Hearing, tympanic membrane, ear canal and external ear normal.  Tympanic membrane is not erythematous, retracted or bulging.     Left Ear: Hearing, tympanic membrane, ear canal and external ear normal. Tympanic membrane is not erythematous, retracted or bulging.     Nose: Mucosal edema present. No rhinorrhea.     Right Sinus: Maxillary sinus tenderness present. No frontal sinus tenderness.     Left Sinus: No maxillary sinus tenderness or frontal sinus tenderness.     Mouth/Throat:     Mouth: Oropharynx is clear and moist and mucous membranes are normal.     Pharynx: Uvula midline.  Eyes:     General: Lids are normal. Lids are everted, no foreign bodies appreciated.     Extraocular Movements: EOM normal.     Conjunctiva/sclera: Conjunctivae normal.     Pupils: Pupils are equal, round, and reactive to light.  Neck:     Thyroid: No thyroid mass or thyromegaly.     Vascular: No carotid bruit.     Trachea: Trachea normal.  Cardiovascular:     Rate and Rhythm: Normal rate and regular rhythm.     Pulses: Normal pulses.     Heart sounds: Normal heart sounds, S1 normal and S2 normal. No murmur heard.    No friction rub. No gallop.  Pulmonary:     Effort: Pulmonary effort is normal. No tachypnea or respiratory distress.     Breath sounds: Normal breath sounds. No decreased breath sounds, wheezing, rhonchi or rales.  Abdominal:     General: Bowel sounds are normal.     Palpations: Abdomen is soft.     Tenderness: There is no abdominal tenderness.  Musculoskeletal:     Cervical back: Normal range of motion and neck supple.  Skin:    General: Skin is warm, dry and intact.     Findings: No rash.     Comments: No current rash, excoriations on right back with associated inflammation  Neurological:     Mental Status: She is alert.  Psychiatric:        Mood and Affect: Mood is not anxious or depressed.        Speech: Speech normal.        Behavior: Behavior normal. Behavior is cooperative.        Thought Content: Thought content normal.        Cognition  and Memory: Cognition and memory normal.        Judgment: Judgment normal.       Results for orders placed or performed in visit on 07/05/23  POC COVID-19  Result Value Ref Range   SARS Coronavirus 2 Ag Positive (A) Negative    Assessment and Plan  Allergic sinusitis Assessment &  Plan: Acute, symptoms most likely related to allergic sinusitis although possible residual pressure and swelling from COVID infection recently is on the differential.  No clear sign of bacterial sinus infection at this time.  Recommended nondrowsy antihistamine like Zyrtec or Xyzal at bedtime.  Nasal saline irrigation.  Continue azelastine 0.1% 1 spray per nostril daily.  If symptoms or not improving as expected she will complete low/moderate-dose prednisone taper   Rash of back Assessment & Plan: Acute, although rash is unilateral it is not consistent with shingles as there is no pain or blisters.  Most likely allergic reaction.  Unclear if trigger.  Continue using topical triamcinolone 0.1 percent twice daily as needed.   Other orders -     predniSONE; 3 tabs by mouth daily x 3 days, then 2 tabs by mouth daily x 2 days then 1 tab by mouth daily x 2 days  Dispense: 15 tablet; Refill: 0    No follow-ups on file.   Kerby Nora, MD

## 2023-07-19 NOTE — Assessment & Plan Note (Signed)
Acute, although rash is unilateral it is not consistent with shingles as there is no pain or blisters.  Most likely allergic reaction.  Unclear if trigger.  Continue using topical triamcinolone 0.1 percent twice daily as needed.

## 2023-07-19 NOTE — Assessment & Plan Note (Signed)
Acute, symptoms most likely related to allergic sinusitis although possible residual pressure and swelling from COVID infection recently is on the differential.  No clear sign of bacterial sinus infection at this time.  Recommended nondrowsy antihistamine like Zyrtec or Xyzal at bedtime.  Nasal saline irrigation.  Continue azelastine 0.1% 1 spray per nostril daily.  If symptoms or not improving as expected she will complete low/moderate-dose prednisone taper

## 2023-08-10 ENCOUNTER — Encounter: Payer: Self-pay | Admitting: Family Medicine

## 2023-08-10 MED ORDER — HYDROCORTISONE (PERIANAL) 2.5 % EX CREA: 1.0000 | TOPICAL_CREAM | Freq: Two times a day (BID) | CUTANEOUS | 0 refills | Status: AC

## 2023-09-04 ENCOUNTER — Other Ambulatory Visit: Payer: Self-pay | Admitting: Family Medicine

## 2024-04-29 ENCOUNTER — Encounter: Payer: Self-pay | Admitting: Family Medicine

## 2024-04-29 DIAGNOSIS — B3731 Acute candidiasis of vulva and vagina: Secondary | ICD-10-CM

## 2024-04-29 MED ORDER — NYSTATIN 100000 UNIT/GM EX CREA: 1.0000 | TOPICAL_CREAM | Freq: Two times a day (BID) | CUTANEOUS | 0 refills | Status: AC

## 2024-04-29 NOTE — Telephone Encounter (Signed)
 Last office visit 07/19/2023 for sinusitis.  Last refilled 10/11/2017 for 30 g with no refills.  Next Appt: CPE 07/01/2024

## 2024-05-26 ENCOUNTER — Ambulatory Visit (INDEPENDENT_AMBULATORY_CARE_PROVIDER_SITE_OTHER): Payer: Medicare PPO

## 2024-05-26 VITALS — BP 118/84 | Ht 65.0 in | Wt 114.0 lb

## 2024-05-26 DIAGNOSIS — Z0000 Encounter for general adult medical examination without abnormal findings: Secondary | ICD-10-CM | POA: Diagnosis not present

## 2024-05-26 NOTE — Patient Instructions (Signed)
 Ms. Mccaffery , Thank you for taking time out of your busy schedule to complete your Annual Wellness Visit with me. I enjoyed our conversation and look forward to speaking with you again next year. I, as well as your care team,  appreciate your ongoing commitment to your health goals. Please review the following plan we discussed and let me know if I can assist you in the future. Your Game plan/ To Do List    Referrals: If you haven't heard from the office you've been referred to, please reach out to them at the phone provided.   Follow up Visits: We will see or speak with you next year for your Next Medicare AWV with our clinical staff Have you seen your provider in the last 6 months (3 months if uncontrolled diabetes)? No  Clinician Recommendations:  Aim for 30 minutes of exercise or brisk walking, 6-8 glasses of water, and 5 servings of fruits and vegetables each day.       This is a list of the screenings recommended for you:  Health Maintenance  Topic Date Due   Zoster (Shingles) Vaccine (1 of 2) Never done   Mammogram  01/25/2018   DTaP/Tdap/Td vaccine (2 - Tdap) 11/19/2022   COVID-19 Vaccine (4 - 2024-25 season) 06/24/2023   Flu Shot  05/23/2024   Medicare Annual Wellness Visit  06/04/2024   Pneumococcal Vaccine for age over 38  Completed   DEXA scan (bone density measurement)  Completed   Hepatitis C Screening  Completed   Hepatitis B Vaccine  Aged Out   HPV Vaccine  Aged Out   Meningitis B Vaccine  Aged Out   Cologuard (Stool DNA test)  Discontinued    Advanced directives: (Declined) Advance directive discussed with you today. Even though you declined this today, please call our office should you change your mind, and we can give you the proper paperwork for you to fill out. Advance Care Planning is important because it:  [x]  Makes sure you receive the medical care that is consistent with your values, goals, and preferences  [x]  It provides guidance to your family and loved  ones and reduces their decisional burden about whether or not they are making the right decisions based on your wishes.  Follow the link provided in your after visit summary or read over the paperwork we have mailed to you to help you started getting your Advance Directives in place. If you need assistance in completing these, please reach out to us  so that we can help you!  See attachments for Preventive Care and Fall Prevention Tips.

## 2024-05-26 NOTE — Progress Notes (Signed)
 Because this visit was a virtual/telehealth visit,  certain criteria was not obtained, such a blood pressure, CBG if applicable, and timed get up and go. Any medications not marked as taking were not mentioned during the medication reconciliation part of the visit. Any vitals not documented were not able to be obtained due to this being a telehealth visit or patient was unable to self-report a recent blood pressure reading due to a lack of equipment at home via telehealth. Vitals that have been documented are verbally provided by the patient.  This visit was performed by a medical professional under my direct supervision. I was immediately available for consultation/collaboration. I have reviewed and agree with the Annual Wellness Visit documentation.  Subjective:   Erica Kirk is a 78 y.o. who presents for a Medicare Wellness preventive visit.  As a reminder, Annual Wellness Visits don't include a physical exam, and some assessments may be limited, especially if this visit is performed virtually. We may recommend an in-person follow-up visit with your provider if needed.  Visit Complete: Virtual I connected with  Erica Kirk on 05/26/24 by a audio enabled telemedicine application and verified that I am speaking with the correct person using two identifiers.  Patient Location: Home  Provider Location: Home Office  I discussed the limitations of evaluation and management by telemedicine. The patient expressed understanding and agreed to proceed.  Vital Signs: Because this visit was a virtual/telehealth visit, some criteria may be missing or patient reported. Any vitals not documented were not able to be obtained and vitals that have been documented are patient reported.  VideoDeclined- This patient declined Librarian, academic. Therefore the visit was completed with audio only.  Persons Participating in Visit: Patient.  AWV Questionnaire: Yes: Patient  Medicare AWV questionnaire was completed by the patient on 05/20/2024; I have confirmed that all information answered by patient is correct and no changes since this date.  Cardiac Risk Factors include: advanced age (>80men, >69 women);hypertension;dyslipidemia     Objective:    Today's Vitals   05/26/24 1519  BP: 118/84  Weight: 114 lb (51.7 kg)  Height: 5' 5 (1.651 m)   Body mass index is 18.97 kg/m.     05/26/2024    3:17 PM 01/23/2017   10:57 AM  Advanced Directives  Does Patient Have a Medical Advance Directive? No No   Would patient like information on creating a medical advance directive? No - Patient declined No - Patient declined      Data saved with a previous flowsheet row definition    Current Medications (verified) Outpatient Encounter Medications as of 05/26/2024  Medication Sig   azelastine (ASTELIN) 0.1 % nasal spray Place 1 spray into both nostrils daily as needed.   Cholecalciferol (VITAMIN D) 2000 units tablet Take 2,000 Units by mouth once.   famotidine (PEPCID) 20 MG tablet Take 20 mg by mouth daily.   fluticasone  (FLONASE ) 50 MCG/ACT nasal spray Place 2 sprays into both nostrils daily as needed for allergies or rhinitis.   hydrocortisone  (PROCTOSOL HC) 2.5 % rectal cream Place 1 Application rectally 2 (two) times daily.   hydrocortisone  2.5 % cream Apply 1 application topically 2 (two) times daily.   hydroxychloroquine (PLAQUENIL) 200 MG tablet Take 200 mg by mouth daily.   nystatin  cream (MYCOSTATIN ) Apply 1 Application topically 2 (two) times daily.   triamcinolone  cream (KENALOG ) 0.1 % Apply 1 Application topically 2 (two) times daily as needed.   verapamil (VERELAN) 120 MG  24 hr capsule TAKE ONE CAPSULE (120 MG DOSE) BY MOUTH AT BEDTIME.   polyethylene glycol powder (GLYCOLAX/MIRALAX) 17 GM/SCOOP powder Take by mouth daily. (Patient not taking: Reported on 05/26/2024)   predniSONE  (DELTASONE ) 10 MG tablet 3 tabs by mouth daily x 3 days, then 2 tabs by mouth  daily x 2 days then 1 tab by mouth daily x 2 days (Patient not taking: Reported on 05/26/2024)   No facility-administered encounter medications on file as of 05/26/2024.    Allergies (verified) Demerol [meperidine] and Nexium [esomeprazole magnesium]   History: Past Medical History:  Diagnosis Date   Arthritis    GERD (gastroesophageal reflux disease)    Hyperlipidemia    Hypertension    Mammogram abnormal    Osteoporosis    RA (rheumatoid arthritis) (HCC)    Vertigo 10/23/2013   Past Surgical History:  Procedure Laterality Date   BLADDER SUSPENSION  04/21/08   BREAST SURGERY     COLONOSCOPY  04/16/08   COSMETIC SURGERY  08/06/00   ESOPHAGOGASTRODUODENOSCOPY ENDOSCOPY  12/06/11   SHOULDER SURGERY Left 09/20/05   Family History  Problem Relation Age of Onset   Lung disease Father    Cancer Father    Heart disease Father    Rheum arthritis Paternal Uncle    Social History   Socioeconomic History   Marital status: Unknown    Spouse name: Not on file   Number of children: Not on file   Years of education: Not on file   Highest education level: Bachelor's degree (e.g., BA, AB, BS)  Occupational History   Not on file  Tobacco Use   Smoking status: Never   Smokeless tobacco: Never  Substance and Sexual Activity   Alcohol use: Yes    Comment: social   Drug use: No   Sexual activity: Yes  Other Topics Concern   Not on file  Social History Narrative   3 sons, one deceased    Other children live in [place].    Diet and exercise: good, walks few times a week.   Retired Runner, broadcasting/film/video    Married   Social Drivers of Corporate investment banker Strain: Low Risk  (05/20/2024)   Overall Financial Resource Strain (CARDIA)    Difficulty of Paying Living Expenses: Not hard at all  Food Insecurity: No Food Insecurity (05/20/2024)   Hunger Vital Sign    Worried About Running Out of Food in the Last Year: Never true    Ran Out of Food in the Last Year: Never true  Transportation  Needs: No Transportation Needs (05/20/2024)   PRAPARE - Administrator, Civil Service (Medical): No    Lack of Transportation (Non-Medical): No  Physical Activity: Sufficiently Active (05/20/2024)   Exercise Vital Sign    Days of Exercise per Week: 5 days    Minutes of Exercise per Session: 30 min  Stress: No Stress Concern Present (05/20/2024)   [employer] of Occupational Health - Occupational Stress Questionnaire    Feeling of Stress: Not at all  Social Connections: Moderately Isolated (05/20/2024)   Social Connection and Isolation Panel    Frequency of Communication with Friends and Family: More than three times a week    Frequency of Social Gatherings with Friends and Family: More than three times a week    Attends Religious Services: Never    Database administrator or Organizations: No    Attends Banker Meetings: Never    Marital Status: Married  Tobacco Counseling Counseling given: Not Answered    Clinical Intake:  Pre-visit preparation completed: Yes  Pain : No/denies pain     BMI - recorded: 18.97 Nutritional Status: BMI <19  Underweight Nutritional Risks: None Diabetes: No  No results found for: HGBA1C   How often do you need to have someone help you when you read instructions, pamphlets, or other written materials from your doctor or pharmacy?: 1 - Never What is the last grade level you completed in school?: college graduate  Interpreter Needed?: No  Information entered by :: Loyal Rudy,CMA   Activities of Daily Living     05/20/2024    9:56 AM  In your present state of health, do you have any difficulty performing the following activities:  Hearing? 0  Vision? 0  Difficulty concentrating or making decisions? 0  Walking or climbing stairs? 0  Dressing or bathing? 0  Doing errands, shopping? 0  Preparing Food and eating ? N  Using the Toilet? N  In the past six months, have you accidently leaked urine? N  Do  you have problems with loss of bowel control? N  Managing your Medications? N  Managing your Finances? N  Housekeeping or managing your Housekeeping? N    Patient Care Team: Avelina Greig BRAVO, MD as PCP - General (Family Medicine)  I have updated your Care Teams any recent Medical Services you may have received from other providers in the past year.     Assessment:   This is a routine wellness examination for Erica Kirk.  Hearing/Vision screen Hearing Screening - Comments:: No difficulties hearing  Vision Screening - Comments:: Patient wears glasses    Goals Addressed             This Visit's Progress    Patient Stated       To exercise more and see grandkids more       Depression Screen     05/26/2024    3:23 PM 06/19/2023    9:30 AM 02/12/2018    9:33 AM 01/23/2017   11:40 AM  PHQ 2/9 Scores  PHQ - 2 Score 0 0 0 0  PHQ- 9 Score 0       Fall Risk     05/20/2024    9:56 AM 06/19/2023    9:30 AM 02/12/2018    9:33 AM 01/23/2017   10:14 AM  Fall Risk   Falls in the past year? 0 0 No  Yes   Number falls in past yr: 0 0    Injury with Fall? 0 0  Yes   Risk for fall due to : No Fall Risks No Fall Risks    Follow up Falls evaluation completed Falls evaluation completed       Data saved with a previous flowsheet row definition    MEDICARE RISK AT HOME:  Medicare Risk at Home Any stairs in or around the home?: (Patient-Rptd) Yes If so, are there any without handrails?: (Patient-Rptd) No Home free of loose throw rugs in walkways, pet beds, electrical cords, etc?: (Patient-Rptd) Yes Adequate lighting in your home to reduce risk of falls?: (Patient-Rptd) Yes Life alert?: (Patient-Rptd) No Use of a cane, walker or w/c?: (Patient-Rptd) No Grab bars in the bathroom?: (Patient-Rptd) No Shower chair or bench in shower?: (Patient-Rptd) No Elevated toilet seat or a handicapped toilet?: (Patient-Rptd) Yes  TIMED UP AND GO:  Was the test performed?  No  Cognitive Function: 6CIT  completed  05/26/2024    3:24 PM  6CIT Screen  What Year? 0 points  What month? 0 points  What time? 0 points  Count back from 20 0 points  Months in reverse 0 points  Repeat phrase 0 points  Total Score 0 points    Immunizations Immunization History  Administered Date(s) Administered   Fluad Quad(high Dose 65+) 08/16/2020, 08/13/2021   Influenza Inj Mdck Quad Pf 10/20/2022   Influenza, High Dose Seasonal PF 08/05/2019   Influenza, Quadrivalent, Recombinant, Inj, Pf 08/08/2018   Influenza,inj,Quad PF,6+ Mos 07/18/2016, 08/10/2017   Influenza-Unspecified 07/26/2015   PFIZER(Purple Top)SARS-COV-2 Vaccination 01/08/2020, 01/29/2020, 10/25/2020   Pneumococcal Conjugate-13 12/31/2015   Pneumococcal Polysaccharide-23 12/16/2014   Td 11/19/2012    Screening Tests Health Maintenance  Topic Date Due   Zoster Vaccines- Shingrix (1 of 2) Never done   MAMMOGRAM  01/25/2018   DTaP/Tdap/Td (2 - Tdap) 11/19/2022   COVID-19 Vaccine (4 - 2024-25 season) 06/24/2023   INFLUENZA VACCINE  05/23/2024   Medicare Annual Wellness (AWV)  05/26/2025   Pneumococcal Vaccine: 50+ Years  Completed   DEXA SCAN  Completed   Hepatitis C Screening  Completed   Hepatitis B Vaccines  Aged Out   HPV VACCINES  Aged Out   Meningococcal B Vaccine  Aged Out   Fecal DNA (Cologuard)  Discontinued    Health Maintenance  Health Maintenance Due  Topic Date Due   Zoster Vaccines- Shingrix (1 of 2) Never done   MAMMOGRAM  01/25/2018   DTaP/Tdap/Td (2 - Tdap) 11/19/2022   COVID-19 Vaccine (4 - 2024-25 season) 06/24/2023   INFLUENZA VACCINE  05/23/2024   Health Maintenance Items Addressed:patient declined health maintenance   Additional Screening:  Vision Screening: Recommended annual ophthalmology exams for early detection of glaucoma and other disorders of the eye. Would you like a referral to an eye doctor? No    Dental Screening: Recommended annual dental exams for proper oral  hygiene  Community Resource Referral / Chronic Care Management: CRR required this visit?  No   CCM required this visit?  No   Plan:    I have personally reviewed and noted the following in the patient's chart:   Medical and social history Use of alcohol, tobacco or illicit drugs  Current medications and supplements including opioid prescriptions. Patient is not currently taking opioid prescriptions. Functional ability and status Nutritional status Physical activity Advanced directives List of other physicians Hospitalizations, surgeries, and ER visits in previous 12 months Vitals Screenings to include cognitive, depression, and falls Referrals and appointments  In addition, I have reviewed and discussed with patient certain preventive protocols, quality metrics, and best practice recommendations. A written personalized care plan for preventive services as well as general preventive health recommendations were provided to patient.   Lyle MARLA Right, [place]   05/26/2024   After Visit Summary: (MyChart) Due to this being a telephonic visit, the after visit summary with patients personalized plan was offered to patient via MyChart   Notes: Nothing significant to report at this time.

## 2024-06-06 ENCOUNTER — Telehealth: Payer: Self-pay | Admitting: *Deleted

## 2024-06-06 DIAGNOSIS — E7800 Pure hypercholesterolemia, unspecified: Secondary | ICD-10-CM

## 2024-06-06 DIAGNOSIS — M810 Age-related osteoporosis without current pathological fracture: Secondary | ICD-10-CM

## 2024-06-06 NOTE — Telephone Encounter (Signed)
-----   Message from Veva JINNY Ferrari sent at 06/06/2024  3:33 PM EDT ----- Regarding: Lab orders for Tue, 9.1.25 Patient is scheduled for CPX labs, please order future labs, Thanks , Veva

## 2024-06-18 ENCOUNTER — Telehealth: Payer: Medicare PPO | Admitting: Family Medicine

## 2024-06-24 ENCOUNTER — Ambulatory Visit: Payer: Self-pay | Admitting: Family Medicine

## 2024-06-24 ENCOUNTER — Other Ambulatory Visit (INDEPENDENT_AMBULATORY_CARE_PROVIDER_SITE_OTHER): Payer: Medicare PPO

## 2024-06-24 DIAGNOSIS — M810 Age-related osteoporosis without current pathological fracture: Secondary | ICD-10-CM | POA: Diagnosis not present

## 2024-06-24 DIAGNOSIS — E7800 Pure hypercholesterolemia, unspecified: Secondary | ICD-10-CM

## 2024-06-24 LAB — LIPID PANEL
Cholesterol: 201 mg/dL — ABNORMAL HIGH (ref 0–200)
HDL: 87.2 mg/dL (ref >39.00)
LDL Cholesterol: 102 mg/dL — ABNORMAL HIGH (ref 0–99)
NonHDL: 113.97
Total CHOL/HDL Ratio: 2
Triglycerides: 59 mg/dL (ref 0.0–149.0)
VLDL: 11.8 mg/dL (ref 0.0–40.0)

## 2024-06-24 LAB — COMPREHENSIVE METABOLIC PANEL WITH GFR
ALT: 20 U/L (ref 0–35)
AST: 29 U/L (ref 0–37)
Albumin: 4.1 g/dL (ref 3.5–5.2)
Alkaline Phosphatase: 64 U/L (ref 39–117)
BUN: 15 mg/dL (ref 6–23)
CO2: 30 meq/L (ref 19–32)
Calcium: 9.3 mg/dL (ref 8.4–10.5)
Chloride: 98 meq/L (ref 96–112)
Creatinine, Ser: 0.67 mg/dL (ref 0.40–1.20)
GFR: 84.19 mL/min (ref >60.00)
Glucose, Bld: 79 mg/dL (ref 70–99)
Potassium: 4.6 meq/L (ref 3.5–5.1)
Sodium: 136 meq/L (ref 135–145)
Total Bilirubin: 0.8 mg/dL (ref 0.2–1.2)
Total Protein: 7.8 g/dL (ref 6.0–8.3)

## 2024-06-24 LAB — VITAMIN D 25 HYDROXY (VIT D DEFICIENCY, FRACTURES): VITD: 48.09 ng/mL (ref 30.00–100.00)

## 2024-06-24 NOTE — Progress Notes (Signed)
 No critical labs need to be addressed urgently. We will discuss labs in detail at upcoming office visit.

## 2024-07-01 ENCOUNTER — Encounter: Payer: Self-pay | Admitting: Family Medicine

## 2024-07-01 ENCOUNTER — Ambulatory Visit (INDEPENDENT_AMBULATORY_CARE_PROVIDER_SITE_OTHER): Payer: Medicare PPO | Admitting: Family Medicine

## 2024-07-01 ENCOUNTER — Telehealth: Payer: Self-pay

## 2024-07-01 VITALS — BP 124/72 | HR 69 | Temp 97.5°F | Resp 20 | Ht 65.0 in | Wt 115.0 lb

## 2024-07-01 DIAGNOSIS — M0579 Rheumatoid arthritis with rheumatoid factor of multiple sites without organ or systems involvement: Secondary | ICD-10-CM

## 2024-07-01 DIAGNOSIS — Z0000 Encounter for general adult medical examination without abnormal findings: Secondary | ICD-10-CM

## 2024-07-01 DIAGNOSIS — E7800 Pure hypercholesterolemia, unspecified: Secondary | ICD-10-CM | POA: Diagnosis not present

## 2024-07-01 DIAGNOSIS — I10 Essential (primary) hypertension: Secondary | ICD-10-CM | POA: Diagnosis not present

## 2024-07-01 DIAGNOSIS — M810 Age-related osteoporosis without current pathological fracture: Secondary | ICD-10-CM

## 2024-07-01 MED ORDER — VERAPAMIL HCL ER 120 MG PO CP24: 120.0000 mg | ORAL_CAPSULE | Freq: Every day | ORAL | 3 refills | Status: AC

## 2024-07-01 NOTE — Telephone Encounter (Signed)
 Copied from CRM [id number]. Topic: Clinical - Request for Lab/Test Order >> Jul 01, 2024 10:11 AM Robinson DEL wrote: Reason for CRM: Patient has scheduled her annual physical for 07/02/2025 states she has the labs done prior to her appointment every year no orders in system.  Marko [phone]

## 2024-07-01 NOTE — Assessment & Plan Note (Addendum)
 Stable, chronic.  Continue current medication. Followed by Rheumatology:  Dr. Ishmael   Following with eye exam yearly.

## 2024-07-01 NOTE — Assessment & Plan Note (Signed)
Well controlled. Continue current medication.   Verapamil 120 mg daily

## 2024-07-01 NOTE — Assessment & Plan Note (Signed)
Chronic, diet controlled.  High HDL, protective.

## 2024-07-01 NOTE — Progress Notes (Signed)
 Patient ID: Erica Kirk, female    DOB: May 09, 1946, 78 y.o.   MRN: [id number]  This visit was conducted in person.  BP 124/72   Pulse 69   Temp (!) 97.5 F (36.4 C)   Resp 20   Ht 5' 5 (1.651 m)   Wt 115 lb (52.2 kg)   SpO2 97%   BMI 19.14 kg/m    CC:  Chief Complaint  Patient presents with   Annual Exam    Subjective:   HPI: Erica Kirk is a 78 y.o. female presenting on 07/01/2024 for Annual Exam The patient presents for  complete physical and review of chronic health problems. He/She also has the following acute concerns today:   Annual medicare wellness: 05/26/2024 reviewed.  RA followed  by rheumatology yearly. Stable on plaquenil. Plans to reestablish locally.  Walking 3 days a week. Lifting weights.   HTN  well controlled on verpamil BP Readings from Last 3 Encounters:  07/01/24 124/72  05/26/24 118/84  07/19/23 118/84  No CP, SOB, edema.  Elevated Cholesterol:  Lab Results  Component Value Date   CHOL 201 (H) 06/24/2024   HDL 87.20 06/24/2024   LDLCALC 102 (H) 06/24/2024   TRIG 59.0 06/24/2024   CHOLHDL 2 06/24/2024  Using medications without problems: Muscle aches:  Diet compliance:  good. Exercise: walking 4 days a week 1 mile, working out at gym 3-4 days a week Other complaints:    GERD and history of gastritis and constipation:  Using miralax and pepcid daily daily.  She has had to eat less to avoid these issues... has stabilized now. Wt Readings from Last 3 Encounters:  07/01/24 115 lb (52.2 kg)  05/26/24 114 lb (51.7 kg)  07/19/23 101 lb 8 oz (46 kg)  Body mass index is 19.14 kg/m.    Relevant past medical, surgical, family and social history reviewed and updated as indicated. Interim medical history since our last visit reviewed. Allergies and medications reviewed and updated. Outpatient Medications Prior to Visit  Medication Sig Dispense Refill   azelastine (ASTELIN) 0.1 % nasal spray Place 1 spray into both nostrils  daily as needed.     Cholecalciferol (VITAMIN D ) 2000 units tablet Take 2,000 Units by mouth once.     famotidine (PEPCID) 20 MG tablet Take 20 mg by mouth daily. (Patient taking differently: Take 10 mg by mouth daily.)     fluticasone  (FLONASE ) 50 MCG/ACT nasal spray Place 2 sprays into both nostrils daily as needed for allergies or rhinitis.     hydrocortisone  (PROCTOSOL HC) 2.5 % rectal cream Place 1 Application rectally 2 (two) times daily. 30 g 0   hydrocortisone  2.5 % cream Apply 1 application topically 2 (two) times daily.     hydroxychloroquine (PLAQUENIL) 200 MG tablet Take 200 mg by mouth daily. (Patient taking differently: Take 200 mg by mouth 2 (two) times daily.)     nystatin  cream (MYCOSTATIN ) Apply 1 Application topically 2 (two) times daily. 30 g 0   polyethylene glycol powder (GLYCOLAX/MIRALAX) 17 GM/SCOOP powder Take by mouth daily. (Patient taking differently: Take by mouth as needed.)     triamcinolone  cream (KENALOG ) 0.1 % Apply 1 Application topically 2 (two) times daily as needed.     verapamil  (VERELAN ) 120 MG 24 hr capsule TAKE ONE CAPSULE (120 MG DOSE) BY MOUTH AT BEDTIME. 90 capsule 3   predniSONE  (DELTASONE ) 10 MG tablet 3 tabs by mouth daily x 3 days, then 2 tabs by mouth daily  x 2 days then 1 tab by mouth daily x 2 days (Patient not taking: Reported on 05/26/2024) 15 tablet 0   No facility-administered medications prior to visit.     Per HPI unless specifically indicated in ROS section below Review of Systems  Constitutional:  Negative for fatigue and fever.  HENT:  Negative for congestion.   Eyes:  Negative for pain.  Respiratory:  Negative for cough and shortness of breath.   Cardiovascular:  Negative for chest pain, palpitations and leg swelling.  Gastrointestinal:  Negative for abdominal pain.  Genitourinary:  Negative for dysuria and vaginal bleeding.  Musculoskeletal:  Negative for back pain.  Neurological:  Negative for syncope, light-headedness and  headaches.  Psychiatric/Behavioral:  Negative for dysphoric mood.    Objective:  BP 124/72   Pulse 69   Temp (!) 97.5 F (36.4 C)   Resp 20   Ht 5' 5 (1.651 m)   Wt 115 lb (52.2 kg)   SpO2 97%   BMI 19.14 kg/m   Wt Readings from Last 3 Encounters:  07/01/24 115 lb (52.2 kg)  05/26/24 114 lb (51.7 kg)  07/19/23 101 lb 8 oz (46 kg)      Physical Exam Vitals and nursing note reviewed.  Constitutional:      General: She is not in acute distress.    Appearance: Normal appearance. She is well-developed. She is not ill-appearing or toxic-appearing.  HENT:     Head: Normocephalic.     Right Ear: Hearing, tympanic membrane, ear canal and external ear normal.     Left Ear: Hearing, tympanic membrane, ear canal and external ear normal.     Nose: Nose normal.  Eyes:     General: Lids are normal. Lids are everted, no foreign bodies appreciated.     Conjunctiva/sclera: Conjunctivae normal.     Pupils: Pupils are equal, round, and reactive to light.  Neck:     Thyroid: No thyroid mass or thyromegaly.     Vascular: No carotid bruit.     Trachea: Trachea normal.  Cardiovascular:     Rate and Rhythm: Normal rate and regular rhythm.     Heart sounds: Normal heart sounds, S1 normal and S2 normal. No murmur heard.    No gallop.  Pulmonary:     Effort: Pulmonary effort is normal. No respiratory distress.     Breath sounds: Normal breath sounds. No wheezing, rhonchi or rales.  Abdominal:     General: Bowel sounds are normal. There is no distension or abdominal bruit.     Palpations: Abdomen is soft. There is no fluid wave or mass.     Tenderness: There is no abdominal tenderness. There is no guarding or rebound.     Hernia: No hernia is present.  Musculoskeletal:     Cervical back: Normal range of motion and neck supple.  Lymphadenopathy:     Cervical: No cervical adenopathy.  Skin:    General: Skin is warm and dry.     Findings: No rash.  Neurological:     Mental Status: She is  alert.     Cranial Nerves: No cranial nerve deficit.     Sensory: No sensory deficit.  Psychiatric:        Mood and Affect: Mood is not anxious or depressed.        Speech: Speech normal.        Behavior: Behavior normal. Behavior is cooperative.        Judgment: Judgment normal.  Results for orders placed or performed in visit on 06/24/24  VITAMIN D  25 Hydroxy (Vit-D Deficiency, Fractures)   Collection Time: 06/24/24  7:35 AM  Result Value Ref Range   VITD 48.09 30.00 - 100.00 ng/mL  Lipid panel   Collection Time: 06/24/24  7:35 AM  Result Value Ref Range   Cholesterol 201 (H) 0 - 200 mg/dL   Triglycerides 40.9 0.0 - 149.0 mg/dL   HDL 12.79 >60.99 mg/dL   VLDL 88.1 0.0 - 59.9 mg/dL   LDL Cholesterol 897 (H) 0 - 99 mg/dL   Total CHOL/HDL Ratio 2    NonHDL 113.97   Comprehensive metabolic panel   Collection Time: 06/24/24  7:35 AM  Result Value Ref Range   Sodium 136 135 - 145 mEq/L   Potassium 4.6 3.5 - 5.1 mEq/L   Chloride 98 96 - 112 mEq/L   CO2 30 19 - 32 mEq/L   Glucose, Bld 79 70 - 99 mg/dL   BUN 15 6 - 23 mg/dL   Creatinine, Ser 9.32 0.40 - 1.20 mg/dL   Total Bilirubin 0.8 0.2 - 1.2 mg/dL   Alkaline Phosphatase 64 39 - 117 U/L   AST 29 0 - 37 U/L   ALT 20 0 - 35 U/L   Total Protein 7.8 6.0 - 8.3 g/dL   Albumin 4.1 3.5 - 5.2 g/dL   GFR 15.80 >39.99 mL/min   Calcium 9.3 8.4 - 10.5 mg/dL    Assessment and Plan The patient's preventative maintenance and recommended screening tests for an annual wellness exam were reviewed in full today. Brought up to date unless services declined.  Counselled on the importance of diet, exercise, and its role in overall health and mortality. The patient's FH and SH was reviewed, including their home life, tobacco status, and drug and alcohol status.   Vaccines: Recommended flu, Tdap and shingrix... refused. Uptodate with PNA Pap/DVE:  not indicated. Mammo:  overdue... pt refuses despite increased risk. Bone Density:  overdue.. will schedule. Colon:   Cologuard neg 3 years ago, not indicated due to age Smoking Status: none ETOH/ drug use: none/none  Hep C:  done    Routine general medical examination at a health care facility  Rheumatoid arthritis involving multiple sites with positive rheumatoid factor (HCC) Assessment & Plan: Stable, chronic.  Continue current medication. Followed by Rheumatology:  Dr. Ishmael   Following with eye exam yearly.     Primary hypertension Assessment & Plan: Well controlled. Continue current medication.   Verapamil  120 mg daily   High cholesterol Assessment & Plan:  Chronic, diet controlled.   High HDL, protective.    Age-related osteoporosis without current pathological fracture -     DG Bone Density; Future  Other orders -     Verapamil  HCl ER; Take 1 capsule (120 mg total) by mouth at bedtime.  Dispense: 90 capsule; Refill: 3     Return for phone AMW,  fasting labs then CPE with me.   Greig Ring, MD

## 2024-07-03 NOTE — Telephone Encounter (Signed)
 Please schedule labs prior to CPE next year, PCP will place orders closer to appt date

## 2024-08-27 ENCOUNTER — Ambulatory Visit: Payer: Self-pay

## 2024-08-27 DIAGNOSIS — L853 Xerosis cutis: Secondary | ICD-10-CM | POA: Diagnosis not present

## 2024-08-27 DIAGNOSIS — L814 Other melanin hyperpigmentation: Secondary | ICD-10-CM | POA: Diagnosis not present

## 2024-08-27 DIAGNOSIS — L821 Other seborrheic keratosis: Secondary | ICD-10-CM | POA: Diagnosis not present

## 2024-08-27 DIAGNOSIS — L820 Inflamed seborrheic keratosis: Secondary | ICD-10-CM | POA: Diagnosis not present

## 2024-08-27 DIAGNOSIS — L570 Actinic keratosis: Secondary | ICD-10-CM | POA: Diagnosis not present

## 2024-08-27 DIAGNOSIS — D1801 Hemangioma of skin and subcutaneous tissue: Secondary | ICD-10-CM | POA: Diagnosis not present

## 2024-08-27 DIAGNOSIS — L538 Other specified erythematous conditions: Secondary | ICD-10-CM | POA: Diagnosis not present

## 2024-08-27 NOTE — Telephone Encounter (Signed)
 Appt scheduled

## 2024-08-27 NOTE — Telephone Encounter (Signed)
 FYI Only or Action Required?: FYI only for provider: appointment scheduled on 08/29/2024.  Patient was last seen in primary care on 07/01/2024 by Avelina Greig BRAVO, MD.  Called Nurse Triage reporting Facial Pain.  Symptoms began several days ago.  Interventions attempted: Other: Nasal spray.  Symptoms are: gradually worsening.  Triage Disposition: See PCP When Office is Open (Within 3 Days)  Patient/caregiver understands and will follow disposition?: Yes      Copied from CRM #[id number]. Topic: Clinical - Red Word Triage >> Aug 27, 2024  3:25 PM Frederich PARAS wrote: [facility] that prompted transfer to Nurse Triage: pain  Pt thinks she has a sinus infection, whoe head is stuffy, trouble breathing through nose, teech hurt, ear hurt, face hurt, pain behind eyes, Reason for Disposition  [1] Sinus congestion (pressure, fullness) AND [2] present > 10 days    Pt states symptoms started a few days ago.  Answer Assessment - Initial Assessment Questions 1. LOCATION: Where does it hurt?      R side of the face, behind her eyes  2. ONSET: When did the sinus pain start?  (e.g., hours, days)      A few days  3. SEVERITY: How bad is the pain?   (Scale 0-10; or none, mild, moderate or severe)     Moderate   4. RECURRENT SYMPTOM: Have you ever had sinus problems before? If Yes, ask: When was the last time? and What happened that time?      Yes 5. NASAL CONGESTION: Is the nose blocked? If Yes, ask: Can you open it or must you breathe through your mouth?     Yes, hard to breathe out of nose  6. NASAL DISCHARGE: Do you have discharge from your nose? If so ask, What color?     Denies  7. FEVER: Do you have a fever? If Yes, ask: What is it, how was it measured, and when did it start?      Denies  8. OTHER SYMPTOMS: Do you have any other symptoms? (e.g., sore throat, cough, earache, difficulty breathing)     Tooth pain, R ear pain now resolved  Not taking otc meds for symptoms. Used  nasal spray for symptoms.  Protocols used: Sinus Pain or Congestion-A-AH

## 2024-08-28 DIAGNOSIS — Z79899 Other long term (current) drug therapy: Secondary | ICD-10-CM | POA: Diagnosis not present

## 2024-08-28 DIAGNOSIS — H16223 Keratoconjunctivitis sicca, not specified as Sjogren's, bilateral: Secondary | ICD-10-CM | POA: Diagnosis not present

## 2024-08-28 DIAGNOSIS — H2513 Age-related nuclear cataract, bilateral: Secondary | ICD-10-CM | POA: Diagnosis not present

## 2024-08-29 ENCOUNTER — Ambulatory Visit (INDEPENDENT_AMBULATORY_CARE_PROVIDER_SITE_OTHER): Admitting: Family Medicine

## 2024-08-29 ENCOUNTER — Ambulatory Visit: Admitting: Family Medicine

## 2024-08-29 ENCOUNTER — Ambulatory Visit: Payer: Self-pay | Admitting: *Deleted

## 2024-08-29 VITALS — BP 110/84 | HR 66 | Temp 97.9°F | Ht 65.5 in | Wt 115.0 lb

## 2024-08-29 DIAGNOSIS — J0100 Acute maxillary sinusitis, unspecified: Secondary | ICD-10-CM | POA: Diagnosis not present

## 2024-08-29 MED ORDER — AMOXICILLIN 500 MG PO CAPS: 1000.0000 mg | ORAL_CAPSULE | Freq: Two times a day (BID) | ORAL | 0 refills | Status: AC

## 2024-08-29 NOTE — Progress Notes (Signed)
 Patient ID: Erica Kirk, female    DOB: 10-14-1946, 78 y.o.   MRN: [id number]  This visit was conducted in person.  BP 110/84   Pulse 66   Temp 97.9 F (36.6 C) (Oral)   Ht 5' 5.5 (1.664 m)   Wt 115 lb (52.2 kg)   SpO2 97%   BMI 18.85 kg/m    CC:  Chief Complaint  Patient presents with   URI    Sinus issues, started about 5 days ago. Symptoms: pain behind eyes, headache, stuffy nose, teeth  and ears hurts. Face hurts. Hasn't tried anything OTC. Nasal sprays dont help. Can not recall being exposed to anything.     Subjective:   HPI: Erica Kirk is a 78 y.o. female presenting on 08/29/2024 for URI (Sinus issues, started about 5 days ago. Symptoms: pain behind eyes, headache, stuffy nose, teeth  and ears hurts. Face hurts. Hasn't tried anything OTC. Nasal sprays dont help. Can not recall being exposed to anything. )   Date of onset:   weeks of congestion , worse in last 5 days ago Initial symptoms included pain behind eyes, headache, stuffy nose, teeth and  right ear hurt Symptoms progressed to face pain  No SOB, no wheeze, No fever   Sick contacts: none COVID testing:   none     She has tried to treat with azelastine... caused worsened headache.     No history of chronic lung disease such as asthma or COPD. Non-smoker. allergies in past.. worse in fall.      Relevant past medical, surgical, family and social history reviewed and updated as indicated. Interim medical history since our last visit reviewed. Allergies and medications reviewed and updated. Outpatient Medications Prior to Visit  Medication Sig Dispense Refill   azelastine (ASTELIN) 0.1 % nasal spray Place 1 spray into both nostrils daily as needed.     Cholecalciferol (VITAMIN D ) 2000 units tablet Take 2,000 Units by mouth once.     famotidine (PEPCID) 20 MG tablet Take 20 mg by mouth daily. (Patient taking differently: Take 10 mg by mouth daily.)     fluticasone  (FLONASE ) 50 MCG/ACT nasal  spray Place 2 sprays into both nostrils daily as needed for allergies or rhinitis.     hydrocortisone  (PROCTOSOL HC) 2.5 % rectal cream Place 1 Application rectally 2 (two) times daily. 30 g 0   hydrocortisone  2.5 % cream Apply 1 application topically 2 (two) times daily.     hydroxychloroquine (PLAQUENIL) 200 MG tablet Take 200 mg by mouth daily. (Patient taking differently: Take 200 mg by mouth 2 (two) times daily.)     nystatin  cream (MYCOSTATIN ) Apply 1 Application topically 2 (two) times daily. 30 g 0   polyethylene glycol powder (GLYCOLAX/MIRALAX) 17 GM/SCOOP powder Take by mouth daily. (Patient taking differently: Take by mouth as needed.)     RESTASIS 0.05 % ophthalmic emulsion 1 drop 2 (two) times daily.     triamcinolone  cream (KENALOG ) 0.1 % Apply 1 Application topically 2 (two) times daily as needed.     verapamil  (VERELAN ) 120 MG 24 hr capsule Take 1 capsule (120 mg total) by mouth at bedtime. 90 capsule 3   predniSONE  (DELTASONE ) 10 MG tablet 3 tabs by mouth daily x 3 days, then 2 tabs by mouth daily x 2 days then 1 tab by mouth daily x 2 days (Patient not taking: Reported on 08/29/2024) 15 tablet 0   No facility-administered medications prior to visit.  Per HPI unless specifically indicated in ROS section below Review of Systems  Constitutional:  Negative for fatigue and fever.  HENT:  Positive for congestion, sinus pressure and sinus pain.   Eyes:  Negative for pain.  Respiratory:  Negative for cough and shortness of breath.   Cardiovascular:  Negative for chest pain, palpitations and leg swelling.  Gastrointestinal:  Negative for abdominal pain.  Genitourinary:  Negative for dysuria and vaginal bleeding.  Musculoskeletal:  Negative for back pain.  Neurological:  Negative for syncope, light-headedness and headaches.  Psychiatric/Behavioral:  Negative for dysphoric mood.    Objective:  BP 110/84   Pulse 66   Temp 97.9 F (36.6 C) (Oral)   Ht 5' 5.5 (1.664 m)   Wt  115 lb (52.2 kg)   SpO2 97%   BMI 18.85 kg/m   Wt Readings from Last 3 Encounters:  08/29/24 115 lb (52.2 kg)  07/01/24 115 lb (52.2 kg)  05/26/24 114 lb (51.7 kg)      Physical Exam Constitutional:      General: She is not in acute distress.    Appearance: Normal appearance. She is well-developed. She is not ill-appearing or toxic-appearing.  HENT:     Head: Normocephalic.     Right Ear: Hearing, ear canal and external ear normal. A middle ear effusion is present. Tympanic membrane is not erythematous, retracted or bulging.     Left Ear: Hearing, tympanic membrane, ear canal and external ear normal. Tympanic membrane is not erythematous, retracted or bulging.     Nose: No mucosal edema or rhinorrhea.     Right Turbinates: Not swollen.     Left Turbinates: Not swollen.     Right Sinus: Maxillary sinus tenderness present. No frontal sinus tenderness.     Left Sinus: No maxillary sinus tenderness or frontal sinus tenderness.     Mouth/Throat:     Pharynx: Uvula midline.  Eyes:     General: Lids are normal. Lids are everted, no foreign bodies appreciated.     Conjunctiva/sclera: Conjunctivae normal.     Pupils: Pupils are equal, round, and reactive to light.  Neck:     Thyroid: No thyroid mass or thyromegaly.     Vascular: No carotid bruit.     Trachea: Trachea normal.  Cardiovascular:     Rate and Rhythm: Normal rate and regular rhythm.     Pulses: Normal pulses.     Heart sounds: Normal heart sounds, S1 normal and S2 normal. No murmur heard.    No friction rub. No gallop.  Pulmonary:     Effort: Pulmonary effort is normal. No tachypnea or respiratory distress.     Breath sounds: Normal breath sounds. No decreased breath sounds, wheezing, rhonchi or rales.  Abdominal:     General: Bowel sounds are normal.     Palpations: Abdomen is soft.     Tenderness: There is no abdominal tenderness.  Musculoskeletal:     Cervical back: Normal range of motion and neck supple.  Skin:     General: Skin is warm and dry.     Findings: No rash.  Neurological:     Mental Status: She is alert.  Psychiatric:        Mood and Affect: Mood is not anxious or depressed.        Speech: Speech normal.        Behavior: Behavior normal. Behavior is cooperative.        Thought Content: Thought content normal.  Judgment: Judgment normal.       Results for orders placed or performed in visit on 06/24/24  VITAMIN D  25 Hydroxy (Vit-D Deficiency, Fractures)   Collection Time: 06/24/24  7:35 AM  Result Value Ref Range   VITD 48.09 30.00 - 100.00 ng/mL  Lipid panel   Collection Time: 06/24/24  7:35 AM  Result Value Ref Range   Cholesterol 201 (H) 0 - 200 mg/dL   Triglycerides 40.9 0.0 - 149.0 mg/dL   HDL 12.79 >60.99 mg/dL   VLDL 88.1 0.0 - 59.9 mg/dL   LDL Cholesterol 897 (H) 0 - 99 mg/dL   Total CHOL/HDL Ratio 2    NonHDL 113.97   Comprehensive metabolic panel   Collection Time: 06/24/24  7:35 AM  Result Value Ref Range   Sodium 136 135 - 145 mEq/L   Potassium 4.6 3.5 - 5.1 mEq/L   Chloride 98 96 - 112 mEq/L   CO2 30 19 - 32 mEq/L   Glucose, Bld 79 70 - 99 mg/dL   BUN 15 6 - 23 mg/dL   Creatinine, Ser 9.32 0.40 - 1.20 mg/dL   Total Bilirubin 0.8 0.2 - 1.2 mg/dL   Alkaline Phosphatase 64 39 - 117 U/L   AST 29 0 - 37 U/L   ALT 20 0 - 35 U/L   Total Protein 7.8 6.0 - 8.3 g/dL   Albumin 4.1 3.5 - 5.2 g/dL   GFR 15.80 >39.99 mL/min   Calcium 9.3 8.4 - 10.5 mg/dL    Assessment and Plan  Acute non-recurrent maxillary sinusitis Assessment & Plan: Acute, likely underlying allergic sinusitis/congestion now with concern for bacterial sinus infection. Recommended prednisone  taper but she declined. Will treat with amoxicillin  500 mg 2 tablets twice daily to cover for bacterial superinfection. Start nasal saline irrigation or spray, start Flonase  2 sprays per nostril daily. If underlying congestion after infection symptoms is not improving start Xyzal at bedtime for  allergies.  Return and ER precautions provided.   Other orders -     Amoxicillin ; Take 2 capsules (1,000 mg total) by mouth 2 (two) times daily.  Dispense: 40 capsule; Refill: 0    No follow-ups on file.   Greig Ring, MD

## 2024-08-29 NOTE — Patient Instructions (Addendum)
 Flonase  2 spray per nostril daily.  Nasal saline spray/irrigation.  Can add Xyzal at bedtime for allergies.

## 2024-08-29 NOTE — Assessment & Plan Note (Signed)
 Acute, likely underlying allergic sinusitis/congestion now with concern for bacterial sinus infection. Recommended prednisone  taper but she declined. Will treat with amoxicillin  500 mg 2 tablets twice daily to cover for bacterial superinfection. Start nasal saline irrigation or spray, start Flonase  2 sprays per nostril daily. If underlying congestion after infection symptoms is not improving start Xyzal at bedtime for allergies.  Return and ER precautions provided.

## 2024-08-29 NOTE — Telephone Encounter (Signed)
 FYI Only or Action Required?: Action required by provider: request for appointment.  Patient was last seen in primary care on 07/01/2024 by Avelina Greig BRAVO, MD.  Called Nurse Triage reporting Sinusitis.  Symptoms began several days ago.  Interventions attempted: Nothing.  Symptoms are: gradually worsening.  Triage Disposition: See HCP Within 4 Hours (Or PCP Triage)  Patient/caregiver understands and will follow disposition?: No, refuses disposition  Patient declines UC - she does not want exposure to flu. Patient wants to come to office- no opening at PCP or alternative location  Copied from CRM #[id number]. Topic: Clinical - Red Word Triage >> Aug 29, 2024  9:02 AM Avram MATSU wrote: Red Word that prompted transfer to Nurse Triage: sinus infection, pain behind eyes, stuffy nose, headache Reason for Disposition  [1] SEVERE sinus pain (e.g., excruciating) AND [2] not improved 2 hours after pain medicine  Answer Assessment - Initial Assessment Questions 1. LOCATION: Where does it hurt?      Sinus pressure, headache, pain behind the eye-R 2. ONSET: When did the sinus pain start?  (e.g., hours, days)      Monday 3. SEVERITY: How bad is the pain?   (Scale 0-10; or none, mild, moderate or severe)     8/10 4. RECURRENT SYMPTOM: Have you ever had sinus problems before? If Yes, ask: When was the last time? and What happened that time?      Yes- been a while 5. NASAL CONGESTION: Is the nose blocked? If Yes, ask: Can you open it or must you breathe through your mouth?     Nasal congestion- blocked 6. NASAL DISCHARGE: Do you have discharge from your nose? If so ask, What color?     No 7. FEVER: Do you have a fever? If Yes, ask: What is it, how was it measured, and when did it start?      Not checked 8. OTHER SYMPTOMS: Do you have any other symptoms? (e.g., sore throat, cough, earache, difficulty breathing)     Ear ache  Protocols used: Sinus Pain or Congestion-A-AH

## 2024-08-29 NOTE — Telephone Encounter (Signed)
 After speaking with Dr Avelina, called pt and offered her 3pm today with Dr Avelina. Pt said yes she could make it.

## 2024-09-11 ENCOUNTER — Telehealth: Payer: Self-pay | Admitting: Family Medicine

## 2024-09-11 NOTE — Telephone Encounter (Signed)
 Copied from CRM [id number]. Topic: Referral - Question >> Sep 11, 2024 10:36 AM Charolett L wrote: Reason for CRM: Patient is calling in to request a referral for a mammogram and wanted some info on how to get it done. Requesting a call back

## 2024-09-11 NOTE — Telephone Encounter (Signed)
 Patient had physical September 2025.  Looks like last mammogram was done in 2017 at [facility].  She can call breast center to schedule. Please call the location of your choice from the menu below to schedule your Mammogram and/or Bone Density appointment.    [facility]   [facility]                      Phone:  [phone] [address]                               [place], [place] [address]                                                             Services: Traditional and 3D Mammogram, Bone Density

## 2024-09-12 ENCOUNTER — Other Ambulatory Visit: Payer: Self-pay | Admitting: Family Medicine

## 2024-09-12 DIAGNOSIS — Z1231 Encounter for screening mammogram for malignant neoplasm of breast: Secondary | ICD-10-CM

## 2024-09-12 NOTE — Telephone Encounter (Signed)
 Erica Kirk notified by telephone that she can call and set up her own mammogram screening at [facility].  Phone number provided.

## 2024-09-26 ENCOUNTER — Ambulatory Visit

## 2024-10-27 ENCOUNTER — Ambulatory Visit

## 2025-05-27 ENCOUNTER — Ambulatory Visit

## 2025-06-25 ENCOUNTER — Other Ambulatory Visit

## 2025-07-02 ENCOUNTER — Encounter: Admitting: Family Medicine
